# Patient Record
Sex: Male | Born: 1935
Health system: Southern US, Community
[De-identification: ages and names within clinical notes are randomized; demographics above are authoritative.]

## PROBLEM LIST (undated history)

## (undated) DIAGNOSIS — H269 Unspecified cataract: Secondary | ICD-10-CM

## (undated) DIAGNOSIS — I10 Essential (primary) hypertension: Secondary | ICD-10-CM

## (undated) DIAGNOSIS — R413 Other amnesia: Secondary | ICD-10-CM

## (undated) DIAGNOSIS — E538 Deficiency of other specified B group vitamins: Secondary | ICD-10-CM

## (undated) HISTORY — DX: Essential (primary) hypertension: I10

## (undated) HISTORY — DX: Deficiency of other specified B group vitamins: E53.8

## (undated) HISTORY — PX: COLOSTOMY: SHX63

## (undated) HISTORY — DX: Other amnesia: R41.3

## (undated) HISTORY — DX: Unspecified cataract: H26.9

---

## 2016-05-02 DIAGNOSIS — R413 Other amnesia: Secondary | ICD-10-CM | POA: Diagnosis not present

## 2016-05-02 DIAGNOSIS — Z23 Encounter for immunization: Secondary | ICD-10-CM | POA: Diagnosis not present

## 2016-05-02 DIAGNOSIS — H25013 Cortical age-related cataract, bilateral: Secondary | ICD-10-CM | POA: Diagnosis not present

## 2016-05-04 DIAGNOSIS — E538 Deficiency of other specified B group vitamins: Secondary | ICD-10-CM | POA: Diagnosis not present

## 2016-05-05 DIAGNOSIS — E538 Deficiency of other specified B group vitamins: Secondary | ICD-10-CM | POA: Diagnosis not present

## 2016-05-09 DIAGNOSIS — E538 Deficiency of other specified B group vitamins: Secondary | ICD-10-CM | POA: Diagnosis not present

## 2016-05-10 DIAGNOSIS — E538 Deficiency of other specified B group vitamins: Secondary | ICD-10-CM | POA: Diagnosis not present

## 2016-05-11 DIAGNOSIS — E538 Deficiency of other specified B group vitamins: Secondary | ICD-10-CM | POA: Diagnosis not present

## 2016-06-20 DIAGNOSIS — Z0001 Encounter for general adult medical examination with abnormal findings: Secondary | ICD-10-CM | POA: Diagnosis not present

## 2016-06-20 DIAGNOSIS — Z23 Encounter for immunization: Secondary | ICD-10-CM | POA: Diagnosis not present

## 2016-06-20 DIAGNOSIS — I10 Essential (primary) hypertension: Secondary | ICD-10-CM | POA: Diagnosis not present

## 2016-06-20 DIAGNOSIS — E538 Deficiency of other specified B group vitamins: Secondary | ICD-10-CM | POA: Diagnosis not present

## 2016-06-20 DIAGNOSIS — Z79899 Other long term (current) drug therapy: Secondary | ICD-10-CM | POA: Diagnosis not present

## 2016-06-20 DIAGNOSIS — Z136 Encounter for screening for cardiovascular disorders: Secondary | ICD-10-CM | POA: Diagnosis not present

## 2016-06-20 DIAGNOSIS — R413 Other amnesia: Secondary | ICD-10-CM | POA: Diagnosis not present

## 2016-07-07 ENCOUNTER — Ambulatory Visit (INDEPENDENT_AMBULATORY_CARE_PROVIDER_SITE_OTHER): Payer: Medicare Other | Admitting: Neurology

## 2016-07-07 ENCOUNTER — Encounter: Payer: Self-pay | Admitting: Neurology

## 2016-07-07 DIAGNOSIS — R413 Other amnesia: Secondary | ICD-10-CM

## 2016-07-07 DIAGNOSIS — E538 Deficiency of other specified B group vitamins: Secondary | ICD-10-CM

## 2016-07-07 HISTORY — DX: Deficiency of other specified B group vitamins: E53.8

## 2016-07-07 HISTORY — DX: Other amnesia: R41.3

## 2016-07-07 MED ORDER — DONEPEZIL HCL 5 MG PO TABS: 5.0000 mg | ORAL_TABLET | Freq: Every day | ORAL | 1 refills | Status: DC

## 2016-07-07 NOTE — Progress Notes (Signed)
Reason for visit: Memory disturbance  Referring physician: Dr. Francis GainesKoirala  Wyatt King is a 80 y.o. male  History of present illness:  Wyatt King is a 80 year old right-handed black male with a history of a progressive memory disturbance. The patient has been having difficulty with memory for least one year. The patient is now living with his daughter since April 2017. The patient lost his car as it got repossessed at least a year ago, he was not making the payments on the car. The patient has had increasing difficulty with managing his finances, his daughter now has taken over this task. The patient requires assistance with keeping up with medications and appointments. He has difficulty remembering names for people, and remembering recent events. The patient therefore is no longer operating a motor vehicle. The patient denies any numbness or weakness of the face, arms, or legs. He has some slight gait instability, he denies any recent falls. He has some urgency with the bladder. He was found to have a significant vitamin B12 deficiency, he is now on B12 injections. He has not reported a lot of fatigue issues. He claims that he sleeps well at night. He is sent to this office for an evaluation.  Past Medical History:  Diagnosis Date  . Memory disorder 07/07/2016    Past Surgical History:  Procedure Laterality Date  . COLOSTOMY      History reviewed. No pertinent family history.  Social history:  reports that he has never smoked. He has never used smokeless tobacco. He reports that he does not drink alcohol or use drugs.  Medications:  Prior to Admission medications   Medication Sig Start Date End Date Taking? Authorizing Provider  Multiple Vitamin (MULTIVITAMIN) tablet Take 1 tablet by mouth daily.   Yes Historical Provider, MD     No Known Allergies  ROS:  Out of a complete 14 system review of symptoms, the patient complains only of the following symptoms, and all other  reviewed systems are negative.  Blurred vision Memory loss, confusion, dizziness  Blood pressure (!) 154/84, pulse (!) 53, height 5\' 2"  (1.575 m), weight 210 lb (95.3 kg).  Physical Exam  General: The patient is alert and cooperative at the time of the examination. The patient is minimally obese.  Eyes: Pupils are equal, round, and reactive to light. Discs are flat bilaterally.  Neck: The neck is supple, no carotid bruits are noted.  Respiratory: The respiratory examination is clear.  Cardiovascular: The cardiovascular examination reveals a regular rate and rhythm, no obvious murmurs or rubs are noted.  Skin: Extremities are without significant edema.  Neurologic Exam  Mental status: The patient is alert and oriented x 3 at the time of the examination. The Mini-Mental Status Examination done today shows a total score 19/30.  Cranial nerves: Facial symmetry is present. There is good sensation of the face to pinprick and soft touch bilaterally. The strength of the facial muscles and the muscles to head turning and shoulder shrug are normal bilaterally. Speech is well enunciated, no aphasia or dysarthria is noted. Extraocular movements are full. Visual fields are full. The tongue is midline, and the patient has symmetric elevation of the soft palate. No obvious hearing deficits are noted.  Motor: The motor testing reveals 5 over 5 strength of all 4 extremities. Good symmetric motor tone is noted throughout.  Sensory: Sensory testing is intact to pinprick, soft touch, vibration sensation, and position sense on all 4 extremities. No evidence of extinction is  noted.  Coordination: Cerebellar testing reveals good finger-nose-finger and heel-to-shin bilaterally.  Gait and station: Gait is normal. Tandem gait is normal. Romberg is negative. No drift is seen.  Reflexes: Deep tendon reflexes are symmetric and normal bilaterally. Toes are downgoing bilaterally.   Assessment/Plan:  1.  Progressive memory disturbance  2. Vitamin B12 deficiency  The patient will be set up for further blood work today. He will have MRI evaluation of the brain. I have called in a prescription for Aricept to begin at 5 mg at night. He will contact our office in one month if he is doing well, we will convert him to a 10 mg maintenance dose of Aricept. He will follow-up in 6 months for reevaluation.  Marlan Palau. Keith Willis MD 07/07/2016 8:48 AM  [facility] [address] [place], [place] [id number]  Phone [phone] Fax [phone]

## 2016-07-07 NOTE — Patient Instructions (Signed)
   We will check MRI of the brain and some blood work.   We will start Aricept (donepezil) 5 mg at night. Call in one month if this is tolerated and we will start the 10 mg dose of the medication.  Begin Aricept (donepezil) at 5 mg at night for one month. If this medication is well-tolerated, please call our office and we will call in a prescription for the 10 mg tablets. Look out for side effects that may include nausea, diarrhea, weight loss, or stomach cramps. This medication will also cause a runny nose, therefore there is no need for allergy medications for this purpose.

## 2016-07-09 LAB — SEDIMENTATION RATE: Sed Rate: 12 mm/hr (ref 0–30)

## 2016-07-09 LAB — RPR: RPR Ser Ql: NONREACTIVE

## 2016-07-09 LAB — COPPER, SERUM: Copper: 120 ug/dL (ref 72–166)

## 2016-07-10 ENCOUNTER — Telehealth: Payer: Self-pay

## 2016-07-10 NOTE — Telephone Encounter (Signed)
Called pt w/ unremarkable lab results. May call back w/ additional questions/concerns. 

## 2016-07-10 NOTE — Telephone Encounter (Signed)
-----   Message from York Spanielharles K Willis, MD sent at 07/09/2016  4:38 PM EST -----  The blood work results are unremarkable. Please call the patient.  ----- Message ----- From: Nell RangeInterface, [facility] Lab Results In Sent: 07/08/2016   7:42 AM To: York Spanielharles K Willis, MD

## 2016-07-19 ENCOUNTER — Ambulatory Visit
Admission: RE | Admit: 2016-07-19 | Discharge: 2016-07-19 | Disposition: A | Discharge status: Home / Self Care | Payer: Medicare Other | Source: Ambulatory Visit | Attending: Neurology | Admitting: Neurology

## 2016-07-19 DIAGNOSIS — R413 Other amnesia: Secondary | ICD-10-CM

## 2016-07-21 ENCOUNTER — Telehealth: Payer: Self-pay | Admitting: Neurology

## 2016-07-21 DIAGNOSIS — E538 Deficiency of other specified B group vitamins: Secondary | ICD-10-CM | POA: Diagnosis not present

## 2016-07-21 NOTE — Telephone Encounter (Signed)
I called the patient. MRI of the brain does not show significant SVD, some atrophy seen especially in the mesial temporal areas, could be C/W Alzheimer's.  The patient is to continue the Aricept.   MRI brain 07/20/16:  IMPRESSION:  Abnormal MRI brain (without) demonstrating: 1. Mild diffuse and moderate-severe mesial temporal atrophy. 2. Mild scattered periventricular and subcortical foci of non-specific gliosis. 3. Moderate mucosal thickening in the frontal, ethmoid and maxillary sinuses; especially in the right frontal supraorbital region. 4. No acute findings.

## 2016-08-18 DIAGNOSIS — E538 Deficiency of other specified B group vitamins: Secondary | ICD-10-CM | POA: Diagnosis not present

## 2016-08-25 DIAGNOSIS — H43823 Vitreomacular adhesion, bilateral: Secondary | ICD-10-CM | POA: Diagnosis not present

## 2016-08-25 DIAGNOSIS — H2511 Age-related nuclear cataract, right eye: Secondary | ICD-10-CM | POA: Diagnosis not present

## 2016-08-25 DIAGNOSIS — H25013 Cortical age-related cataract, bilateral: Secondary | ICD-10-CM | POA: Diagnosis not present

## 2016-08-25 DIAGNOSIS — H18413 Arcus senilis, bilateral: Secondary | ICD-10-CM | POA: Diagnosis not present

## 2016-08-25 DIAGNOSIS — H2513 Age-related nuclear cataract, bilateral: Secondary | ICD-10-CM | POA: Diagnosis not present

## 2016-08-25 DIAGNOSIS — H25043 Posterior subcapsular polar age-related cataract, bilateral: Secondary | ICD-10-CM | POA: Diagnosis not present

## 2016-09-19 ENCOUNTER — Other Ambulatory Visit: Payer: Self-pay | Admitting: Neurology

## 2016-09-22 ENCOUNTER — Other Ambulatory Visit: Payer: Self-pay | Admitting: Neurology

## 2016-12-18 DIAGNOSIS — H2511 Age-related nuclear cataract, right eye: Secondary | ICD-10-CM | POA: Diagnosis not present

## 2017-01-04 ENCOUNTER — Ambulatory Visit: Payer: Medicare Other | Admitting: Adult Health

## 2017-01-08 ENCOUNTER — Encounter: Payer: Self-pay | Admitting: Adult Health

## 2017-01-08 DIAGNOSIS — H2512 Age-related nuclear cataract, left eye: Secondary | ICD-10-CM | POA: Diagnosis not present

## 2018-01-15 ENCOUNTER — Emergency Department (HOSPITAL_COMMUNITY): Payer: Medicare Other

## 2018-01-15 ENCOUNTER — Emergency Department (HOSPITAL_COMMUNITY)
Admission: EM | Admit: 2018-01-15 | Discharge: 2018-01-15 | Disposition: A | Discharge status: Home / Self Care | Payer: Medicare Other | Attending: Emergency Medicine | Admitting: Emergency Medicine

## 2018-01-15 ENCOUNTER — Encounter (HOSPITAL_COMMUNITY): Payer: Self-pay | Admitting: Nurse Practitioner

## 2018-01-15 ENCOUNTER — Other Ambulatory Visit: Payer: Self-pay

## 2018-01-15 DIAGNOSIS — R251 Tremor, unspecified: Secondary | ICD-10-CM | POA: Diagnosis not present

## 2018-01-15 DIAGNOSIS — Z79899 Other long term (current) drug therapy: Secondary | ICD-10-CM | POA: Insufficient documentation

## 2018-01-15 DIAGNOSIS — R509 Fever, unspecified: Secondary | ICD-10-CM | POA: Diagnosis not present

## 2018-01-15 DIAGNOSIS — N390 Urinary tract infection, site not specified: Secondary | ICD-10-CM | POA: Diagnosis not present

## 2018-01-15 DIAGNOSIS — R531 Weakness: Secondary | ICD-10-CM | POA: Diagnosis not present

## 2018-01-15 DIAGNOSIS — R05 Cough: Secondary | ICD-10-CM | POA: Diagnosis not present

## 2018-01-15 DIAGNOSIS — R404 Transient alteration of awareness: Secondary | ICD-10-CM | POA: Diagnosis not present

## 2018-01-15 LAB — CBC WITH DIFFERENTIAL/PLATELET
Basophils Absolute: 0 10*3/uL (ref 0.0–0.1)
Basophils Relative: 0 %
Eosinophils Absolute: 0.1 10*3/uL (ref 0.0–0.7)
Eosinophils Relative: 1 %
HCT: 40 % (ref 39.0–52.0)
Hemoglobin: 13.3 g/dL (ref 13.0–17.0)
Lymphocytes Relative: 19 %
Lymphs Abs: 1.7 10*3/uL (ref 0.7–4.0)
MCH: 30.4 pg (ref 26.0–34.0)
MCHC: 33.3 g/dL (ref 30.0–36.0)
MCV: 91.3 fL (ref 78.0–100.0)
Monocytes Absolute: 0.7 10*3/uL (ref 0.1–1.0)
Monocytes Relative: 8 %
Neutro Abs: 6.5 10*3/uL (ref 1.7–7.7)
Neutrophils Relative %: 72 %
Platelets: 215 10*3/uL (ref 150–400)
RBC: 4.38 MIL/uL (ref 4.22–5.81)
RDW: 13.2 % (ref 11.5–15.5)
WBC: 8.9 10*3/uL (ref 4.0–10.5)

## 2018-01-15 LAB — URINALYSIS, ROUTINE W REFLEX MICROSCOPIC
Bilirubin Urine: NEGATIVE
Glucose, UA: NEGATIVE mg/dL
Ketones, ur: NEGATIVE mg/dL
Leukocytes, UA: NEGATIVE
Nitrite: POSITIVE — AB
Protein, ur: NEGATIVE mg/dL
Specific Gravity, Urine: 1.012 (ref 1.005–1.030)
pH: 5 (ref 5.0–8.0)

## 2018-01-15 LAB — BASIC METABOLIC PANEL
Anion gap: 12 (ref 5–15)
BUN: 13 mg/dL (ref 6–20)
CO2: 25 mmol/L (ref 22–32)
Calcium: 9 mg/dL (ref 8.9–10.3)
Chloride: 100 mmol/L — ABNORMAL LOW (ref 101–111)
Creatinine, Ser: 0.97 mg/dL (ref 0.61–1.24)
GFR calc Af Amer: >60 mL/min (ref >60)
GFR calc non Af Amer: >60 mL/min (ref >60)
Glucose, Bld: 127 mg/dL — ABNORMAL HIGH (ref 65–99)
Potassium: 3.9 mmol/L (ref 3.5–5.1)
Sodium: 137 mmol/L (ref 135–145)

## 2018-01-15 LAB — LACTIC ACID, PLASMA
Lactic Acid, Venous: 1.8 mmol/L (ref 0.5–1.9)
Lactic Acid, Venous: 1.5 mmol/L (ref 0.5–1.9)

## 2018-01-15 MED ORDER — SODIUM CHLORIDE 0.9 % IV SOLN
2.0000 g | Freq: Once | INTRAVENOUS | Status: AC
  Administered 2018-01-15: 2 g via INTRAVENOUS
  Filled 2018-01-15: qty 20

## 2018-01-15 MED ORDER — CEPHALEXIN 500 MG PO CAPS: 500.0000 mg | ORAL_CAPSULE | Freq: Three times a day (TID) | ORAL | 0 refills | Status: DC

## 2018-01-15 MED ORDER — SODIUM CHLORIDE 0.9 % IV BOLUS
1000.0000 mL | Freq: Once | INTRAVENOUS | Status: AC
  Administered 2018-01-15: 1000 mL via INTRAVENOUS

## 2018-01-15 NOTE — ED Provider Notes (Signed)
[facility]-EMERGENCY DEPT Provider Note   CSN: [id number] Arrival date & time: 01/15/18  1012     History   Chief Complaint No chief complaint on file.   HPI Wyatt King is a 82 y.o. male.  HPI   82yM with weakness. Began feeling poorly 2 days and and has progressed from there. Feels very weak with activity and legs will tremble, particularly the right, when he is walking. Denies any pain. No cough. No dyspnea. No SOB. No urinary complaints. No change in out put from ostomy.   Past Medical History:  Diagnosis Date  . Memory disorder 07/07/2016  . Vitamin B12 deficiency 07/07/2016    Patient Active Problem List   Diagnosis Date Noted  . Memory disorder 07/07/2016  . Vitamin B12 deficiency 07/07/2016    Past Surgical History:  Procedure Laterality Date  . COLOSTOMY          Home Medications    Prior to Admission medications   Medication Sig Start Date End Date Taking? Authorizing Provider  donepezil (ARICEPT) 5 MG tablet TAKE 1 TABLET BY MOUTH AT BEDTIME 09/19/16   York Spaniel, MD  Multiple Vitamin (MULTIVITAMIN) tablet Take 1 tablet by mouth daily.    [provider]    Family History Family History  Problem Relation Age of Onset  . Stroke Father     Social History Social History   Tobacco Use  . Smoking status: Never Smoker  . Smokeless tobacco: Never Used  Substance Use Topics  . Alcohol use: No  . Drug use: No     Allergies   Patient has no known allergies.   Review of Systems Review of Systems  All systems reviewed and negative, other than as noted in HPI.  Physical Exam Updated Vital Signs BP (!) 160/70   Pulse 70   Temp 99.7 F (37.6 C) (Oral)   Resp 16   SpO2 96%   Physical Exam  Constitutional: He is oriented to person, place, and time. He appears well-developed and well-nourished. No distress.  HENT:  Head: Normocephalic and atraumatic.  Eyes: Conjunctivae are normal. Right eye  exhibits no discharge. Left eye exhibits no discharge.  Neck: Neck supple.  Cardiovascular: Normal rate, regular rhythm and normal heart sounds. Exam reveals no gallop and no friction rub.  No murmur heard. Pulmonary/Chest: Effort normal and breath sounds normal. No respiratory distress.  Abdominal: Soft. He exhibits no distension. There is no tenderness.  Musculoskeletal: He exhibits no edema or tenderness.  Neurological: He is alert and oriented to person, place, and time. No cranial nerve deficit. He exhibits normal muscle tone. Coordination normal.  Skin: Skin is warm and dry.  Psychiatric: He has a normal mood and affect. His behavior is normal. Thought content normal.  Nursing note and vitals reviewed.    ED Treatments / Results  Labs (all labs ordered are listed, but only abnormal results are displayed) Labs Reviewed  BASIC METABOLIC PANEL - Abnormal; Notable for the following components:      Result Value   Chloride 100 (*)    Glucose, Bld 127 (*)    All other components within normal limits  URINALYSIS, ROUTINE W REFLEX MICROSCOPIC - Abnormal; Notable for the following components:   Hgb urine dipstick SMALL (*)    Nitrite POSITIVE (*)    Bacteria, UA MANY (*)    All other components within normal limits  URINE CULTURE  CBC WITH DIFFERENTIAL/PLATELET  LACTIC ACID, PLASMA  LACTIC ACID,  PLASMA    EKG EKG Interpretation  Date/Time:  Tuesday Jan 15 2018 13:57:00 EDT Ventricular Rate:  64 PR Interval:    QRS Duration: 97 QT Interval:  436 QTC Calculation: 450 R Axis:   76 Text Interpretation:  Sinus rhythm Non-specific ST-t changes Confirmed by Raeford Razor [phone]) on 01/16/2018 10:48:46 AM   Radiology Dg Chest 2 View  Result Date: 01/15/2018 CLINICAL DATA:  Fever and cough EXAM: CHEST - 2 VIEW COMPARISON:  None. FINDINGS: Lungs are clear. The heart size and pulmonary vascularity are normal. No adenopathy. There is degenerative change in the thoracic spine.  IMPRESSION: No edema or consolidation. Electronically Signed   By: Bretta Bang III M.D.   On: 01/15/2018 11:09   Ct Head Wo Contrast  Result Date: 01/15/2018 CLINICAL DATA:  Fever and weakness.  Right-sided tremors. EXAM: CT HEAD WITHOUT CONTRAST TECHNIQUE: Contiguous axial images were obtained from the base of the skull through the vertex without intravenous contrast. COMPARISON:  MRI brain dated July 19, 2016. FINDINGS: Brain: No evidence of acute infarction, hemorrhage, hydrocephalus, extra-axial collection or mass lesion/mass effect. Stable mild age related cerebral atrophy. Vascular: Calcified atherosclerosis at the skullbase. No hyperdense vessel. Skull: Normal. Negative for fracture or focal lesion. Sinuses/Orbits: No acute finding. Mild bilateral paranasal sinus disease. No air-fluid levels. Other: None. IMPRESSION: 1. Normal for age noncontrast head CT. Electronically Signed   By: Obie Dredge M.D.   On: 01/15/2018 13:20    Procedures Procedures (including critical care time)  Medications Ordered in ED Medications  sodium chloride 0.9 % bolus 1,000 mL (has no administration in time range)     Initial Impression / Assessment and Plan / ED Course  I have reviewed the triage vital signs and the nursing notes.  Pertinent labs & imaging results that were available during my care of the patient were reviewed by me and considered in my medical decision making (see chart for details).     82 year old male with generalized weakness.  Likely related to UTI.  Family reports some mild confusion from his baseline he seems to be fairly lucid to me currently.  He was given a dose of Rocephin in the emergency room.  He will be discharged with a prescription for Keflex.  Urine culture will be sent.  Return precautions discussed with patient and his family at bedside.  Outpatient follow-up otherwise.  Final Clinical Impressions(s) / ED Diagnoses   Final diagnoses:  Urinary tract  infection without hematuria, site unspecified    ED Discharge Orders    None       Raeford Razor, MD 01/16/18 1226

## 2018-01-15 NOTE — ED Triage Notes (Signed)
Patient brought in from home by EMS for fever for 1 day and weakness. Patient has right sided leg pain with tremors on the right side.

## 2018-01-15 NOTE — ED Notes (Signed)
Bed: WA08 Expected date:  Expected time:  Means of arrival:  Comments: EMS-fever 

## 2018-01-15 NOTE — ED Notes (Signed)
Patient voided on his own, did not need to do IN and OUT CATH

## 2018-01-15 NOTE — ED Notes (Signed)
Report given to RN

## 2018-01-15 NOTE — ED Notes (Signed)
Will attempt rectal temp when pt returns from xray

## 2018-01-17 DIAGNOSIS — N3000 Acute cystitis without hematuria: Secondary | ICD-10-CM | POA: Diagnosis not present

## 2018-01-17 DIAGNOSIS — E538 Deficiency of other specified B group vitamins: Secondary | ICD-10-CM | POA: Diagnosis not present

## 2018-01-17 DIAGNOSIS — F0390 Unspecified dementia without behavioral disturbance: Secondary | ICD-10-CM | POA: Diagnosis not present

## 2018-01-17 LAB — URINE CULTURE: Culture: >=100000 — AB

## 2018-01-25 DIAGNOSIS — E538 Deficiency of other specified B group vitamins: Secondary | ICD-10-CM | POA: Diagnosis not present

## 2018-01-25 DIAGNOSIS — F0390 Unspecified dementia without behavioral disturbance: Secondary | ICD-10-CM | POA: Diagnosis not present

## 2018-02-01 DIAGNOSIS — E538 Deficiency of other specified B group vitamins: Secondary | ICD-10-CM | POA: Diagnosis not present

## 2018-04-20 ENCOUNTER — Emergency Department (HOSPITAL_COMMUNITY)
Admission: EM | Admit: 2018-04-20 | Discharge: 2018-04-20 | Disposition: A | Discharge status: Home / Self Care | Payer: Medicare Other | Attending: Emergency Medicine | Admitting: Emergency Medicine

## 2018-04-20 ENCOUNTER — Emergency Department (HOSPITAL_COMMUNITY): Payer: Medicare Other

## 2018-04-20 ENCOUNTER — Other Ambulatory Visit: Payer: Self-pay

## 2018-04-20 DIAGNOSIS — N3000 Acute cystitis without hematuria: Secondary | ICD-10-CM | POA: Diagnosis not present

## 2018-04-20 DIAGNOSIS — R410 Disorientation, unspecified: Secondary | ICD-10-CM | POA: Diagnosis not present

## 2018-04-20 DIAGNOSIS — R4182 Altered mental status, unspecified: Secondary | ICD-10-CM | POA: Diagnosis not present

## 2018-04-20 DIAGNOSIS — F0390 Unspecified dementia without behavioral disturbance: Secondary | ICD-10-CM | POA: Insufficient documentation

## 2018-04-20 DIAGNOSIS — Z79899 Other long term (current) drug therapy: Secondary | ICD-10-CM | POA: Diagnosis not present

## 2018-04-20 LAB — RAPID URINE DRUG SCREEN, HOSP PERFORMED
AMPHETAMINES: NOT DETECTED
Barbiturates: NOT DETECTED
Benzodiazepines: NOT DETECTED
Cocaine: NOT DETECTED
OPIATES: NOT DETECTED
Tetrahydrocannabinol: NOT DETECTED

## 2018-04-20 LAB — URINALYSIS, ROUTINE W REFLEX MICROSCOPIC
Bilirubin Urine: NEGATIVE
Glucose, UA: NEGATIVE mg/dL
HGB URINE DIPSTICK: NEGATIVE
Ketones, ur: NEGATIVE mg/dL
Nitrite: NEGATIVE
PROTEIN: NEGATIVE mg/dL
Specific Gravity, Urine: 1.008 (ref 1.005–1.030)
pH: 7 (ref 5.0–8.0)

## 2018-04-20 LAB — COMPREHENSIVE METABOLIC PANEL
ALK PHOS: 83 U/L (ref 38–126)
ALT: 24 U/L (ref 0–44)
AST: 30 U/L (ref 15–41)
Albumin: 3.8 g/dL (ref 3.5–5.0)
Anion gap: 9 (ref 5–15)
BILIRUBIN TOTAL: 1.6 mg/dL — AB (ref 0.3–1.2)
BUN: 11 mg/dL (ref 8–23)
CALCIUM: 9.1 mg/dL (ref 8.9–10.3)
CO2: 28 mmol/L (ref 22–32)
Chloride: 101 mmol/L (ref 98–111)
Creatinine, Ser: 1.1 mg/dL (ref 0.61–1.24)
GFR calc Af Amer: >60 mL/min (ref >60)
GFR calc non Af Amer: >60 mL/min (ref >60)
GLUCOSE: 94 mg/dL (ref 70–99)
POTASSIUM: 4 mmol/L (ref 3.5–5.1)
Sodium: 138 mmol/L (ref 135–145)
TOTAL PROTEIN: 7.8 g/dL (ref 6.5–8.1)

## 2018-04-20 LAB — I-STAT TROPONIN, ED: Troponin i, poc: 0 ng/mL (ref 0.00–0.08)

## 2018-04-20 LAB — CK: Total CK: 279 U/L (ref 49–397)

## 2018-04-20 LAB — CBC WITH DIFFERENTIAL/PLATELET
ABS IMMATURE GRANULOCYTES: 0 10*3/uL (ref 0.0–0.1)
Basophils Absolute: 0 10*3/uL (ref 0.0–0.1)
Basophils Relative: 1 %
Eosinophils Absolute: 0.1 10*3/uL (ref 0.0–0.7)
Eosinophils Relative: 2 %
HEMATOCRIT: 36.7 % — AB (ref 39.0–52.0)
HEMOGLOBIN: 11.8 g/dL — AB (ref 13.0–17.0)
IMMATURE GRANULOCYTES: 1 %
LYMPHS ABS: 1.6 10*3/uL (ref 0.7–4.0)
LYMPHS PCT: 30 %
MCH: 29.8 pg (ref 26.0–34.0)
MCHC: 32.2 g/dL (ref 30.0–36.0)
MCV: 92.7 fL (ref 78.0–100.0)
Monocytes Absolute: 0.5 10*3/uL (ref 0.1–1.0)
Monocytes Relative: 10 %
NEUTROS PCT: 56 %
Neutro Abs: 2.9 10*3/uL (ref 1.7–7.7)
Platelets: 184 10*3/uL (ref 150–400)
RBC: 3.96 MIL/uL — AB (ref 4.22–5.81)
RDW: 12.6 % (ref 11.5–15.5)
WBC: 5.2 10*3/uL (ref 4.0–10.5)

## 2018-04-20 MED ORDER — SODIUM CHLORIDE 0.9 % IV BOLUS
1000.0000 mL | Freq: Once | INTRAVENOUS | Status: AC
  Administered 2018-04-20: 1000 mL via INTRAVENOUS

## 2018-04-20 MED ORDER — CEPHALEXIN 250 MG PO CAPS
500.0000 mg | ORAL_CAPSULE | Freq: Once | ORAL | Status: AC
  Administered 2018-04-20: 500 mg via ORAL
  Filled 2018-04-20: qty 2

## 2018-04-20 MED ORDER — CEPHALEXIN 500 MG PO CAPS: 500.0000 mg | ORAL_CAPSULE | Freq: Four times a day (QID) | ORAL | 0 refills | Status: DC

## 2018-04-20 NOTE — ED Notes (Signed)
Pt alert and oriented in NAD. Pt and dtr verbalized understanding of discharge instructions.

## 2018-04-20 NOTE — ED Notes (Signed)
Placed pt on 2lpm as SPO2 sats decreased.

## 2018-04-20 NOTE — ED Provider Notes (Signed)
[facility] EMERGENCY DEPARTMENT Provider Note   CSN: [id number] Arrival date & time: 04/20/18  1505     History   Chief Complaint No chief complaint on file.   HPI Wyatt King is a 82 y.o. male.  Patient was found wandering outside.  He was brought in by the EMS.  Then his daughter showed up and she stated that he has significant dementia and has been really confused over the last few months.  Although the last few weeks he is gotten worse.  She stays with small at time  The history is provided by the patient and a relative. No language interpreter was used.  Altered Mental Status   This is a chronic problem. The problem has not changed since onset.Associated symptoms include confusion. Risk factors: History of dementia. His past medical history does not include seizures.    Past Medical History:  Diagnosis Date  . Memory disorder 07/07/2016  . Vitamin B12 deficiency 07/07/2016    Patient Active Problem List   Diagnosis Date Noted  . Memory disorder 07/07/2016  . Vitamin B12 deficiency 07/07/2016    Past Surgical History:  Procedure Laterality Date  . COLOSTOMY          Home Medications    Prior to Admission medications   Medication Sig Start Date End Date Taking? Authorizing Provider  amLODipine (NORVASC) 10 MG tablet Take 10 mg by mouth daily. 12/02/17   [provider]  cephALEXin (KEFLEX) 500 MG capsule Take 1 capsule (500 mg total) by mouth 4 (four) times daily. 04/20/18   Bethann Berkshire, MD  donepezil (ARICEPT) 10 MG tablet Take 10 mg by mouth at bedtime. 12/02/17   [provider]  donepezil (ARICEPT) 5 MG tablet TAKE 1 TABLET BY MOUTH AT BEDTIME Patient not taking: Reported on 01/15/2018 09/19/16   York Spaniel, MD  latanoprost (XALATAN) 0.005 % ophthalmic solution Place 1 drop into both eyes at bedtime. 12/02/17   [provider]  Multiple Vitamin (MULTIVITAMIN) tablet Take 1 tablet by mouth daily.     [provider]    Family History Family History  Problem Relation Age of Onset  . Stroke Father     Social History Social History   Tobacco Use  . Smoking status: Never Smoker  . Smokeless tobacco: Never Used  Substance Use Topics  . Alcohol use: No  . Drug use: No     Allergies   Patient has no known allergies.   Review of Systems Review of Systems  Unable to perform ROS: Dementia  Psychiatric/Behavioral: Positive for confusion.     Physical Exam Updated Vital Signs BP (!) 154/91   Pulse (!) 59   Temp 98.1 F (36.7 C) (Oral)   Resp 16   SpO2 99%   Physical Exam  Constitutional: He appears well-developed.  HENT:  Head: Normocephalic.  Eyes: Conjunctivae and EOM are normal. No scleral icterus.  Neck: Neck supple. No thyromegaly present.  Cardiovascular: Normal rate and regular rhythm. Exam reveals no gallop and no friction rub.  No murmur heard. Pulmonary/Chest: No stridor. He has no wheezes. He has no rales. He exhibits no tenderness.  Abdominal: He exhibits no distension. There is no tenderness. There is no rebound.  Musculoskeletal: Normal range of motion. He exhibits no edema.  Lymphadenopathy:    He has no cervical adenopathy.  Neurological: He is alert. He exhibits normal muscle tone. Coordination normal.  Oriented to person only  Skin: No rash noted. No  erythema.  Psychiatric: He has a normal mood and affect. His behavior is normal.     ED Treatments / Results  Labs (all labs ordered are listed, but only abnormal results are displayed) Labs Reviewed  CBC WITH DIFFERENTIAL/PLATELET - Abnormal; Notable for the following components:      Result Value   RBC 3.96 (*)    Hemoglobin 11.8 (*)    HCT 36.7 (*)    All other components within normal limits  COMPREHENSIVE METABOLIC PANEL - Abnormal; Notable for the following components:   Total Bilirubin 1.6 (*)    All other components within normal limits  URINALYSIS, ROUTINE W REFLEX  MICROSCOPIC - Abnormal; Notable for the following components:   Leukocytes, UA SMALL (*)    Bacteria, UA MANY (*)    All other components within normal limits  URINE CULTURE  CK  RAPID URINE DRUG SCREEN, HOSP PERFORMED  I-STAT TROPONIN, ED    EKG None  Radiology Dg Chest 2 View  Result Date: 04/20/2018 CLINICAL DATA:  Altered mental status EXAM: CHEST - 2 VIEW COMPARISON:  Jan 15, 2018 FINDINGS: No edema or consolidation. The heart size and pulmonary vascularity are normal. No adenopathy. No pneumothorax. No bone lesions. IMPRESSION: No edema or consolidation. Electronically Signed   By: Bretta BangWilliam  Woodruff III M.D.   On: 04/20/2018 16:17   Ct Head Wo Contrast  Result Date: 04/20/2018 CLINICAL DATA:  Found on sidewalk.  Altered mental status. EXAM: CT HEAD WITHOUT CONTRAST CT CERVICAL SPINE WITHOUT CONTRAST TECHNIQUE: Multidetector CT imaging of the head and cervical spine was performed following the standard protocol without intravenous contrast. Multiplanar CT image reconstructions of the cervical spine were also generated. COMPARISON:  Head CT 01/15/2018 FINDINGS: CT HEAD FINDINGS Brain: No evidence of acute infarction, hemorrhage, hydrocephalus, extra-axial collection or mass lesion/mass effect. Mild deep white matter microangiopathy. Vascular: Calcific atherosclerotic disease at the skull base. Skull: Normal. Negative for fracture or focal lesion. Sinuses/Orbits: Mucosal thickening of bilateral ethmoid, maxillary and right sphenoid sinuses. Other: None. CT CERVICAL SPINE FINDINGS Alignment: Straightening of cervical lordosis. Skull base and vertebrae: No acute fracture. No primary bone lesion or focal pathologic process. 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 Soft tissues and spinal canal: No prevertebral fluid or swelling. No visible canal hematoma. Disc levels: Multilevel moderate in severity osteoarthritic changes. Upper chest: Negative. Other: None. IMPRESSION: No acute  intracranial abnormality. Mild deep white matter microangiopathy. No evidence of acute traumatic injury to the cervical spine. Multilevel osteoarthritic changes. Electronically Signed   By: Ted Mcalpineobrinka  Dimitrova M.D.   On: 04/20/2018 17:35   Ct Cervical Spine Wo Contrast  Result Date: 04/20/2018 CLINICAL DATA:  Found on sidewalk.  Altered mental status. EXAM: CT HEAD WITHOUT CONTRAST CT CERVICAL SPINE WITHOUT CONTRAST TECHNIQUE: Multidetector CT imaging of the head and cervical spine was performed following the standard protocol without intravenous contrast. Multiplanar CT image reconstructions of the cervical spine were also generated. COMPARISON:  Head CT 01/15/2018 FINDINGS: CT HEAD FINDINGS Brain: No evidence of acute infarction, hemorrhage, hydrocephalus, extra-axial collection or mass lesion/mass effect. Mild deep white matter microangiopathy. Vascular: Calcific atherosclerotic disease at the skull base. Skull: Normal. Negative for fracture or focal lesion. Sinuses/Orbits: Mucosal thickening of bilateral ethmoid, maxillary and right sphenoid sinuses. Other: None. CT CERVICAL SPINE FINDINGS Alignment: Straightening of cervical lordosis. Skull base and vertebrae: No acute fracture. No primary bone lesion or focal pathologic process. 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 Soft tissues  and spinal canal: No prevertebral fluid or swelling. No visible canal hematoma. Disc levels: Multilevel moderate in severity osteoarthritic changes. Upper chest: Negative. Other: None. IMPRESSION: No acute intracranial abnormality. Mild deep white matter microangiopathy. No evidence of acute traumatic injury to the cervical spine. Multilevel osteoarthritic changes. Electronically Signed   By: Ted Mcalpineobrinka  Dimitrova M.D.   On: 04/20/2018 17:35    Procedures Procedures (including critical care time)  Medications Ordered in ED Medications  cephALEXin (KEFLEX) capsule 500 mg (has no administration in time  range)  sodium chloride 0.9 % bolus 1,000 mL (0 mLs Intravenous Stopped 04/20/18 1842)     Initial Impression / Assessment and Plan / ED Course  I have reviewed the triage vital signs and the nursing notes.  Pertinent labs & imaging results that were available during my care of the patient were reviewed by me and considered in my medical decision making (see chart for details).     Labs x-rays unremarkable except for urine suggest UTI.  Patient has dementia and urinary tract infection he will be given Keflex and will follow-up with PCP  Final Clinical Impressions(s) / ED Diagnoses   Final diagnoses:  Acute cystitis without hematuria    ED Discharge Orders         Ordered    cephALEXin (KEFLEX) 500 MG capsule  4 times daily     04/20/18 1910           Bethann BerkshireZammit, Naftali Carchi, MD 04/20/18 1916

## 2018-04-20 NOTE — ED Triage Notes (Signed)
Pt to ED via EMS, found by neighbors on sidewalk. Clammy and talking in garbled words on EMS arrival. Still confused but speech clear, passed stroke screen with EMS. Unknown where pt lives or baseline mental status.

## 2018-04-20 NOTE — ED Notes (Signed)
Got patient undress on the monitor did ekg shown to Dr Zammit patient is resting with call bell in reach 

## 2018-04-20 NOTE — Discharge Instructions (Addendum)
Follow-up with your family doctor next week for recheck. 

## 2018-04-20 NOTE — ED Notes (Signed)
Patient transported to CT 

## 2018-04-22 LAB — URINE CULTURE

## 2018-04-23 ENCOUNTER — Telehealth: Payer: Self-pay | Admitting: Emergency Medicine

## 2018-04-23 NOTE — Telephone Encounter (Signed)
Post ED Visit - Positive Culture Follow-up  Culture report reviewed by antimicrobial stewardship pharmacist:  []  Enzo BiNathan Batchelder, Pharm.D. []  Celedonio MiyamotoJeremy Frens, Pharm.D., BCPS AQ-ID []  Garvin FilaMike Maccia, Pharm.D., BCPS []  Georgina PillionElizabeth Martin, Pharm.D., BCPS []  Dalton GardensMinh Pham, [address] BoulevardPharm.D., BCPS, AAHIVP []  Estella HuskMichelle Turner, Pharm.D., BCPS, AAHIVP [x]  Lysle Pearlachel Rumbarger, PharmD, BCPS []  Phillips Climeshuy Dang, PharmD, BCPS []  Agapito GamesAlison Masters, PharmD, BCPS []  Verlan FriendsErin Deja, PharmD  Positive urine culture Treated with cephalexin, organism sensitive to the same and no further patient follow-up is required at this time.  Berle MullMiller, Anaise Sterbenz 04/23/2018, 9:30 AM

## 2018-07-30 ENCOUNTER — Emergency Department (HOSPITAL_COMMUNITY)
Admission: EM | Admit: 2018-07-30 | Discharge: 2018-07-30 | Disposition: A | Discharge status: Home / Self Care | Payer: Medicare Other | Attending: Emergency Medicine | Admitting: Emergency Medicine

## 2018-07-30 ENCOUNTER — Encounter (HOSPITAL_COMMUNITY): Payer: Self-pay

## 2018-07-30 ENCOUNTER — Other Ambulatory Visit: Payer: Self-pay

## 2018-07-30 DIAGNOSIS — Z736 Limitation of activities due to disability: Secondary | ICD-10-CM | POA: Diagnosis not present

## 2018-07-30 DIAGNOSIS — Z789 Other specified health status: Secondary | ICD-10-CM | POA: Insufficient documentation

## 2018-07-30 DIAGNOSIS — R413 Other amnesia: Secondary | ICD-10-CM | POA: Diagnosis present

## 2018-07-30 LAB — CBC
HCT: 37.3 % — ABNORMAL LOW (ref 39.0–52.0)
HEMOGLOBIN: 11.6 g/dL — AB (ref 13.0–17.0)
MCH: 29.7 pg (ref 26.0–34.0)
MCHC: 31.1 g/dL (ref 30.0–36.0)
MCV: 95.4 fL (ref 80.0–100.0)
PLATELETS: 209 10*3/uL (ref 150–400)
RBC: 3.91 MIL/uL — AB (ref 4.22–5.81)
RDW: 13 % (ref 11.5–15.5)
WBC: 7.3 10*3/uL (ref 4.0–10.5)
nRBC: 0 % (ref 0.0–0.2)

## 2018-07-30 LAB — URINALYSIS, ROUTINE W REFLEX MICROSCOPIC
Bilirubin Urine: NEGATIVE
GLUCOSE, UA: NEGATIVE mg/dL
Ketones, ur: NEGATIVE mg/dL
Leukocytes, UA: NEGATIVE
NITRITE: NEGATIVE
PH: 6 (ref 5.0–8.0)
PROTEIN: NEGATIVE mg/dL
Specific Gravity, Urine: 1.011 (ref 1.005–1.030)

## 2018-07-30 LAB — BASIC METABOLIC PANEL
Anion gap: 9 (ref 5–15)
BUN: 8 mg/dL (ref 8–23)
CO2: 26 mmol/L (ref 22–32)
Calcium: 8.8 mg/dL — ABNORMAL LOW (ref 8.9–10.3)
Chloride: 102 mmol/L (ref 98–111)
Creatinine, Ser: 0.79 mg/dL (ref 0.61–1.24)
GFR calc Af Amer: >60 mL/min (ref >60)
Glucose, Bld: 104 mg/dL — ABNORMAL HIGH (ref 70–99)
POTASSIUM: 3.5 mmol/L (ref 3.5–5.1)
SODIUM: 137 mmol/L (ref 135–145)

## 2018-07-30 NOTE — Clinical Social Work Note (Addendum)
Clinical Social Work Assessment  Patient Details  Name: Wyatt King MRN: [id number] Date of Birth: 04-26-1936  Date of referral:  07/30/18               Reason for consult:  Facility Placement                Permission sought to share information with:  Family Supports Permission granted to share information::  Yes, Verbal Permission Granted  Name::     Wyatt King [phone]  Agency::     Relationship::     Contact Information:     Housing/Transportation Living arrangements for the past 2 months:  [place] of Information:  Patient Patient Interpreter Needed:  None Criminal Activity/Legal Involvement Pertinent to Current Situation/Hospitalization:    Significant Relationships:  Adult Children, Other(Comment)(Daughter in law ) Lives with:  Adult Children Do you feel safe going back to the place where you live?  Yes Need for family participation in patient care:  Yes (Comment)  Care giving concerns:  CSW consulted for assistance with placement. Per pt and pt's daughter in law, pt ambulates with no difficulty at home and does not use any equipment.   Social Worker assessment / plan:  CSW met with pt and pt's daughter in law at bedside. Pt is from home, where he is living with another daughter, Wyatt King. Pt's daughter in law has concerns about care pt is receiving at home. Pt's daughter in law is looking to have pt placed in a Memory Care unit. CSW reviewed steps for Memory Care placement from the community. CSW provided pt's daughter in law with the contact information for DSS APS and Assisted Living Locators. 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 CSW spoke to pt and pt's daughter in law about reaching out to the [place] to see what services they have available for assisting with placement in Memory Care. CSW spoke with daughter in law about  completing an APS report if she feels pt's money is being taken from him and other concerns she has about pt's home.   RNCM is  establishing pt with home health. Home Health SW can support pt and pt's daughter in law in applying for long term Medicaid, utilizing VA benefits, and placing pt in a Memory Care Unit. Pt and pt's daughter in law feel safe having pt transition home with home health, while working towards [place] Placement.   Employment status:  Retired Forensic scientist:  Medicare PT Recommendations:  Not assessed at this time [place] / Referral to community resources:  Other (Comment Required)(DSS, VA, Assisted Living Locators)  Patient/Family's Response to care:  Pt and pt's daughter in law are agreeable to plan of care, having pt transition home with home health. Pt's daughter in law continuing to work on pt finding placement in [place].   Patient/Family's Understanding of and Emotional Response to Diagnosis, Current Treatment, and Prognosis:  Pt and pt's daughter in law did not have any concerns for CSW at this time. CSW provided pt's daughter in law with resources.   Emotional Assessment Appearance:  Appears stated age Attitude/Demeanor/Rapport:    Affect (typically observed):  Accepting, Adaptable, Appropriate, Pleasant Orientation:    Alcohol / Substance use:    Psych involvement (Current and /or in the community):  No (Comment)  Discharge Needs  Concerns to be addressed:  No discharge needs identified Readmission within the last 30 days:  No Current discharge risk:  None Barriers to Discharge:  No Barriers  Identified   Wendelyn Breslow, LCSW 07/30/2018, 7:43 PM

## 2018-07-30 NOTE — Discharge Instructions (Addendum)
You were evaluated in the Emergency Department and after careful evaluation, we did not find any emergent condition requiring admission or further testing in the hospital.  I am glad that we were able to set up more home health services.  Please return to the Emergency Department if you experience any worsening of your condition.  We encourage you to follow up with a primary care provider.  Thank you for allowing us to be a part of your care.

## 2018-07-30 NOTE — ED Provider Notes (Signed)
[facility] Emergency Department Provider Note MRN:  [id number]  Arrival date & time: 07/30/18     Chief Complaint   Memory Loss   History of Present Illness   Wyatt King is a 82 y.o. year-old male with a history of memory disorder presenting to the ED with chief complaint of memory loss.  According to report, patient is having increased memory loss, no longer caring for self, not managing his colostomy bag, family concerned and wanting a new living situation.  I was unable to obtain an accurate HPI, PMH, or ROS due to the patient's dementia.  Review of Systems  A complete 10 system review of systems was obtained and all systems are negative except as noted in the HPI and PMH.   Patient's Health History    Past Medical History:  Diagnosis Date  . Memory disorder 07/07/2016  . Vitamin B12 deficiency 07/07/2016    Past Surgical History:  Procedure Laterality Date  . COLOSTOMY      Family History  Problem Relation Age of Onset  . Stroke Father     Social History   Socioeconomic History  . Marital status: Widowed    Spouse name: Not on file  . Number of children: 5  . Years of education: Associates  . Highest education level: Not on file  Occupational History  . Occupation: N/A  Social Needs  . Financial resource strain: Not on file  . Food insecurity:    Worry: Not on file    Inability: Not on file  . Transportation needs:    Medical: Not on file    Non-medical: Not on file  Tobacco Use  . Smoking status: Never Smoker  . Smokeless tobacco: Never Used  Substance and Sexual Activity  . Alcohol use: No  . Drug use: No  . Sexual activity: Not on file  Lifestyle  . Physical activity:    Days per week: Not on file    Minutes per session: Not on file  . Stress: Not on file  Relationships  . Social connections:    Talks on phone: Not on file    Gets together: Not on file    Attends religious service: Not on file    Active member of club  or organization: Not on file    Attends meetings of clubs or organizations: Not on file    Relationship status: Not on file  . Intimate partner violence:    Fear of current or ex partner: Not on file    Emotionally abused: Not on file    Physically abused: Not on file    Forced sexual activity: Not on file  Other Topics Concern  . Not on file  Social History Narrative   Lives at home w/ his daughter and grandson   Right-handed   Caffeine: 2-3 cups of coffee as well as tea/soda during the day     Physical Exam  Vital Signs and Nursing Notes reviewed Vitals:   07/30/18 2000 07/30/18 2031  BP: (!) 144/64 (!) 103/53  Pulse:  (!) 59  Resp:    Temp:    SpO2: 100% 99%    CONSTITUTIONAL: Well-appearing, NAD NEURO:  Alert and oriented x 3, no focal deficits EYES:  eyes equal and reactive ENT/NECK:  no LAD, no JVD CARDIO: Regular rate, well-perfused, normal S1 and S2 PULM:  CTAB no wheezing or rhonchi GI/GU:  normal bowel sounds, non-distended, non-tender MSK/SPINE:  No gross deformities, no edema SKIN:  no  rash, atraumatic; non-contained colostomy bag with leaking feculent material PSYCH:  Appropriate speech and behavior  Diagnostic and Interventional Summary    Labs Reviewed  BASIC METABOLIC PANEL - Abnormal; Notable for the following components:      Result Value   Glucose, Bld 104 (*)    Calcium 8.8 (*)    All other components within normal limits  CBC - Abnormal; Notable for the following components:   RBC 3.91 (*)    Hemoglobin 11.6 (*)    HCT 37.3 (*)    All other components within normal limits  URINALYSIS, ROUTINE W REFLEX MICROSCOPIC - Abnormal; Notable for the following components:   APPearance HAZY (*)    Hgb urine dipstick SMALL (*)    Bacteria, UA MANY (*)    All other components within normal limits    No orders to display    Medications - No data to display   Procedures Critical Care  ED Course and Medical Decision Making  I have reviewed the  triage vital signs and the nursing notes.  Pertinent labs & imaging results that were available during my care of the patient were reviewed by me and considered in my medical decision making (see below for details).  Unclear reason for ED visit, no family with patient currently, will attempt to gather more information.  Patient's daughter-in-law arrived and provided more information.  Patient has been living with his daughter, who according to daughter-in-law has not taking care of him.  Patient is losing weight, is covered in feces, his colostomy bag is not being cared for.  Patient's daughter and patient are interested in different living situation.  Patient has no indication for admission at this time, no complaints, labs unremarkable, no UTI, mentally and neurologically at baseline.  Social work and case management consulted, home health resources provided and will start tomorrow morning.  After the discussed management above, the patient was determined to be safe for discharge.  The patient was in agreement with this plan and all questions regarding their care were answered.  ED return precautions were discussed and the patient will return to the ED with any significant worsening of condition.  Elmer Sow. Pilar Plate, MD [facility] Emergency Medicine [facility] [email or web address]  Final Clinical Impressions(s) / ED Diagnoses     ICD-10-CM   1. Decreased activities of daily living (ADL) Z78.9     ED Discharge Orders    None         Sabas Sous, MD 07/30/18 2105

## 2018-07-30 NOTE — Care Management Note (Addendum)
Case Management Note  Patient Details  Name: Wyatt CockingJessie King MRN: [id number] Date of Birth: October 27, 1935  Subjective/Objective:                  Memory Loss  Action/Plan: ED CM and ED CSW spoke with the patient and his daughter-in-law Wyatt Pascalmily Price at the bedside. Wyatt King is requesting assistance with placement. She reports the patient's adopted daughter who is in her 4320s and her 80 year old child live with the patient. She reports the patient is not being taken care of. The patient states other people are in his home besides Wyatt King (his adopted daughter) and the child. The patient reports he would like to move and agrees with Wyatt BurtonEmily that he needs to be in a safe place. Wyatt King reports the patient is not eating and his colostomy bag is not being changed. She states he has not been able to bathe in a while. CSW discussed options for placement by the home health SW. [facility] RN, SW and HHA. They report the patient is able to ambulate without assistance. He does not have or require any DME at this time per the patient and daughter-in-law. Home Health orders were faxed to [facility]. Confirmation received.   Wyatt King (daughter-in-law) is the contact for the patient - [phone].   Expected Discharge Date:    07/30/18              Expected Discharge Plan:  Home w Home Health Services  In-House Referral:  Clinical Social Work  Discharge planning Services  CM Consult  Post Acute Care Choice:  Home Health Choice offered to:  Adult Children, Patient  DME Arranged:  N/A DME Agency:  NA  HH Arranged:  RN, Nurse's Aide, Social Work [facility]  Status of Service:  Completed, signed off  If discussed at [employer] Meetings, dates discussed:    Additional Comments:  [name], [name], RN 07/30/2018, 7:47 PM

## 2018-07-30 NOTE — ED Triage Notes (Addendum)
Pt brought in by daughter with multiple complaints. Pt has a colostomy that has not been changed. He smells of urine. Pt also is reported to have increased memory loss and weakness. Pt noted to have leaking from his colostomy bag. Pt's family member lives out of town and states she is unsure of who is changing his colostomy and caring for him.

## 2018-07-30 NOTE — ED Notes (Signed)
Patient verbalizes understanding of discharge instructions. Opportunity for questioning and answers were provided. 

## 2018-08-08 DIAGNOSIS — G309 Alzheimer's disease, unspecified: Secondary | ICD-10-CM | POA: Diagnosis not present

## 2018-08-08 DIAGNOSIS — I10 Essential (primary) hypertension: Secondary | ICD-10-CM | POA: Diagnosis not present

## 2018-08-08 DIAGNOSIS — Z433 Encounter for attention to colostomy: Secondary | ICD-10-CM | POA: Diagnosis not present

## 2018-08-08 DIAGNOSIS — Z87440 Personal history of urinary (tract) infections: Secondary | ICD-10-CM | POA: Diagnosis not present

## 2018-08-08 DIAGNOSIS — F0280 Dementia in other diseases classified elsewhere without behavioral disturbance: Secondary | ICD-10-CM | POA: Diagnosis not present

## 2018-08-15 DIAGNOSIS — F0280 Dementia in other diseases classified elsewhere without behavioral disturbance: Secondary | ICD-10-CM | POA: Diagnosis not present

## 2018-08-15 DIAGNOSIS — G309 Alzheimer's disease, unspecified: Secondary | ICD-10-CM | POA: Diagnosis not present

## 2018-08-15 DIAGNOSIS — Z433 Encounter for attention to colostomy: Secondary | ICD-10-CM | POA: Diagnosis not present

## 2018-08-15 DIAGNOSIS — I10 Essential (primary) hypertension: Secondary | ICD-10-CM | POA: Diagnosis not present

## 2018-08-15 DIAGNOSIS — Z87440 Personal history of urinary (tract) infections: Secondary | ICD-10-CM | POA: Diagnosis not present

## 2018-08-16 DIAGNOSIS — G309 Alzheimer's disease, unspecified: Secondary | ICD-10-CM | POA: Diagnosis not present

## 2018-08-16 DIAGNOSIS — Z433 Encounter for attention to colostomy: Secondary | ICD-10-CM | POA: Diagnosis not present

## 2018-08-16 DIAGNOSIS — Z87440 Personal history of urinary (tract) infections: Secondary | ICD-10-CM | POA: Diagnosis not present

## 2018-08-16 DIAGNOSIS — I10 Essential (primary) hypertension: Secondary | ICD-10-CM | POA: Diagnosis not present

## 2018-08-16 DIAGNOSIS — F0280 Dementia in other diseases classified elsewhere without behavioral disturbance: Secondary | ICD-10-CM | POA: Diagnosis not present

## 2018-08-21 DIAGNOSIS — I10 Essential (primary) hypertension: Secondary | ICD-10-CM | POA: Diagnosis not present

## 2018-08-21 DIAGNOSIS — Z789 Other specified health status: Secondary | ICD-10-CM | POA: Diagnosis not present

## 2018-08-21 DIAGNOSIS — E538 Deficiency of other specified B group vitamins: Secondary | ICD-10-CM | POA: Diagnosis not present

## 2018-08-21 DIAGNOSIS — Z23 Encounter for immunization: Secondary | ICD-10-CM | POA: Diagnosis not present

## 2018-08-21 DIAGNOSIS — F0390 Unspecified dementia without behavioral disturbance: Secondary | ICD-10-CM | POA: Diagnosis not present

## 2018-08-22 DIAGNOSIS — G309 Alzheimer's disease, unspecified: Secondary | ICD-10-CM | POA: Diagnosis not present

## 2018-08-22 DIAGNOSIS — F0280 Dementia in other diseases classified elsewhere without behavioral disturbance: Secondary | ICD-10-CM | POA: Diagnosis not present

## 2018-08-22 DIAGNOSIS — I10 Essential (primary) hypertension: Secondary | ICD-10-CM | POA: Diagnosis not present

## 2018-08-22 DIAGNOSIS — Z433 Encounter for attention to colostomy: Secondary | ICD-10-CM | POA: Diagnosis not present

## 2018-08-22 DIAGNOSIS — Z87440 Personal history of urinary (tract) infections: Secondary | ICD-10-CM | POA: Diagnosis not present

## 2018-08-23 DIAGNOSIS — G309 Alzheimer's disease, unspecified: Secondary | ICD-10-CM | POA: Diagnosis not present

## 2018-08-23 DIAGNOSIS — F0280 Dementia in other diseases classified elsewhere without behavioral disturbance: Secondary | ICD-10-CM | POA: Diagnosis not present

## 2018-08-23 DIAGNOSIS — I10 Essential (primary) hypertension: Secondary | ICD-10-CM | POA: Diagnosis not present

## 2018-08-23 DIAGNOSIS — Z87440 Personal history of urinary (tract) infections: Secondary | ICD-10-CM | POA: Diagnosis not present

## 2018-08-23 DIAGNOSIS — Z433 Encounter for attention to colostomy: Secondary | ICD-10-CM | POA: Diagnosis not present

## 2018-08-27 DIAGNOSIS — F0280 Dementia in other diseases classified elsewhere without behavioral disturbance: Secondary | ICD-10-CM | POA: Diagnosis not present

## 2018-08-27 DIAGNOSIS — I10 Essential (primary) hypertension: Secondary | ICD-10-CM | POA: Diagnosis not present

## 2018-08-27 DIAGNOSIS — G309 Alzheimer's disease, unspecified: Secondary | ICD-10-CM | POA: Diagnosis not present

## 2018-08-27 DIAGNOSIS — Z433 Encounter for attention to colostomy: Secondary | ICD-10-CM | POA: Diagnosis not present

## 2018-08-27 DIAGNOSIS — Z87440 Personal history of urinary (tract) infections: Secondary | ICD-10-CM | POA: Diagnosis not present

## 2018-08-30 DIAGNOSIS — I10 Essential (primary) hypertension: Secondary | ICD-10-CM | POA: Diagnosis not present

## 2018-08-30 DIAGNOSIS — Z433 Encounter for attention to colostomy: Secondary | ICD-10-CM | POA: Diagnosis not present

## 2018-08-30 DIAGNOSIS — G309 Alzheimer's disease, unspecified: Secondary | ICD-10-CM | POA: Diagnosis not present

## 2018-08-30 DIAGNOSIS — F0280 Dementia in other diseases classified elsewhere without behavioral disturbance: Secondary | ICD-10-CM | POA: Diagnosis not present

## 2018-08-30 DIAGNOSIS — Z87440 Personal history of urinary (tract) infections: Secondary | ICD-10-CM | POA: Diagnosis not present

## 2018-09-15 ENCOUNTER — Other Ambulatory Visit: Payer: Self-pay

## 2018-09-15 ENCOUNTER — Emergency Department (HOSPITAL_COMMUNITY): Payer: Medicare Other

## 2018-09-15 ENCOUNTER — Emergency Department (HOSPITAL_COMMUNITY)
Admission: EM | Admit: 2018-09-15 | Discharge: 2018-09-15 | Disposition: A | Discharge status: Home / Self Care | Payer: Medicare Other | Attending: Emergency Medicine | Admitting: Emergency Medicine

## 2018-09-15 ENCOUNTER — Encounter (HOSPITAL_COMMUNITY): Payer: Self-pay

## 2018-09-15 DIAGNOSIS — M545 Low back pain, unspecified: Secondary | ICD-10-CM

## 2018-09-15 DIAGNOSIS — F0390 Unspecified dementia without behavioral disturbance: Secondary | ICD-10-CM | POA: Insufficient documentation

## 2018-09-15 DIAGNOSIS — R4020 Unspecified coma: Secondary | ICD-10-CM | POA: Diagnosis not present

## 2018-09-15 DIAGNOSIS — R410 Disorientation, unspecified: Secondary | ICD-10-CM | POA: Diagnosis not present

## 2018-09-15 DIAGNOSIS — R457 State of emotional shock and stress, unspecified: Secondary | ICD-10-CM | POA: Diagnosis not present

## 2018-09-15 DIAGNOSIS — Z79899 Other long term (current) drug therapy: Secondary | ICD-10-CM | POA: Insufficient documentation

## 2018-09-15 DIAGNOSIS — M5136 Other intervertebral disc degeneration, lumbar region: Secondary | ICD-10-CM | POA: Diagnosis not present

## 2018-09-15 DIAGNOSIS — I10 Essential (primary) hypertension: Secondary | ICD-10-CM | POA: Diagnosis not present

## 2018-09-15 DIAGNOSIS — R262 Difficulty in walking, not elsewhere classified: Secondary | ICD-10-CM | POA: Diagnosis not present

## 2018-09-15 DIAGNOSIS — M47816 Spondylosis without myelopathy or radiculopathy, lumbar region: Secondary | ICD-10-CM | POA: Diagnosis not present

## 2018-09-15 DIAGNOSIS — R4182 Altered mental status, unspecified: Secondary | ICD-10-CM | POA: Diagnosis not present

## 2018-09-15 DIAGNOSIS — M48061 Spinal stenosis, lumbar region without neurogenic claudication: Secondary | ICD-10-CM | POA: Diagnosis not present

## 2018-09-15 DIAGNOSIS — M479 Spondylosis, unspecified: Secondary | ICD-10-CM | POA: Diagnosis not present

## 2018-09-15 LAB — URINALYSIS, COMPLETE (UACMP) WITH MICROSCOPIC
Bacteria, UA: NONE SEEN
Bilirubin Urine: NEGATIVE
Glucose, UA: NEGATIVE mg/dL
Hgb urine dipstick: NEGATIVE
Ketones, ur: NEGATIVE mg/dL
Leukocytes, UA: NEGATIVE
Nitrite: NEGATIVE
PH: 8 (ref 5.0–8.0)
Protein, ur: NEGATIVE mg/dL
Specific Gravity, Urine: 1.006 (ref 1.005–1.030)

## 2018-09-15 LAB — COMPREHENSIVE METABOLIC PANEL
ALT: 16 U/L (ref 0–44)
ANION GAP: 7 (ref 5–15)
AST: 27 U/L (ref 15–41)
Albumin: 4 g/dL (ref 3.5–5.0)
Alkaline Phosphatase: 81 U/L (ref 38–126)
BILIRUBIN TOTAL: 1.3 mg/dL — AB (ref 0.3–1.2)
BUN: 11 mg/dL (ref 8–23)
CO2: 30 mmol/L (ref 22–32)
Calcium: 8.9 mg/dL (ref 8.9–10.3)
Chloride: 102 mmol/L (ref 98–111)
Creatinine, Ser: 0.94 mg/dL (ref 0.61–1.24)
GFR calc Af Amer: >60 mL/min (ref >60)
Glucose, Bld: 85 mg/dL (ref 70–99)
Potassium: 3.7 mmol/L (ref 3.5–5.1)
Sodium: 139 mmol/L (ref 135–145)
TOTAL PROTEIN: 7.9 g/dL (ref 6.5–8.1)

## 2018-09-15 LAB — CBC WITH DIFFERENTIAL/PLATELET
Abs Immature Granulocytes: 0.01 10*3/uL (ref 0.00–0.07)
BASOS PCT: 1 %
Basophils Absolute: 0 10*3/uL (ref 0.0–0.1)
Eosinophils Absolute: 0.2 10*3/uL (ref 0.0–0.5)
Eosinophils Relative: 4 %
HCT: 36.5 % — ABNORMAL LOW (ref 39.0–52.0)
Hemoglobin: 11.4 g/dL — ABNORMAL LOW (ref 13.0–17.0)
Immature Granulocytes: 0 %
LYMPHS ABS: 2.2 10*3/uL (ref 0.7–4.0)
Lymphocytes Relative: 41 %
MCH: 29.6 pg (ref 26.0–34.0)
MCHC: 31.2 g/dL (ref 30.0–36.0)
MCV: 94.8 fL (ref 80.0–100.0)
Monocytes Absolute: 0.5 10*3/uL (ref 0.1–1.0)
Monocytes Relative: 9 %
Neutro Abs: 2.5 10*3/uL (ref 1.7–7.7)
Neutrophils Relative %: 45 %
Platelets: 187 10*3/uL (ref 150–400)
RBC: 3.85 MIL/uL — ABNORMAL LOW (ref 4.22–5.81)
RDW: 12.9 % (ref 11.5–15.5)
WBC: 5.4 10*3/uL (ref 4.0–10.5)
nRBC: 0 % (ref 0.0–0.2)

## 2018-09-15 LAB — CBG MONITORING, ED: GLUCOSE-CAPILLARY: 100 mg/dL — AB (ref 70–99)

## 2018-09-15 MED ORDER — SODIUM CHLORIDE 0.9 % IV BOLUS
500.0000 mL | Freq: Once | INTRAVENOUS | Status: AC
  Administered 2018-09-15: 500 mL via INTRAVENOUS

## 2018-09-15 MED ORDER — AMLODIPINE BESYLATE 5 MG PO TABS
10.0000 mg | ORAL_TABLET | Freq: Once | ORAL | Status: AC
  Administered 2018-09-15: 10 mg via ORAL
  Filled 2018-09-15: qty 2

## 2018-09-15 NOTE — Progress Notes (Signed)
CSW met with patient and patient's daughter-in-law Delvis Kau) at bedside to discuss SNF. Patient has a history of dementia and was only oriented to self. Patient's daughter in law reported that patient resides with his 81 year old daughter and wanders when left unsupervised. Patient's daughter in law reported that patient wandered last week and they had to find him. Patient's daughter in law reported that she has been unable to place patient in a facility from the community because he doesn't have a POA and she doesn't access to his financial information. Patient's daughter in law reported that patient receives SSI but she is unaware how much he receives, noting patient's daughter reported that she doesn't know how much he receives either. Patient's daughter in law reported that patient's daughter called her and said patient wasn't feeling well or talking much and she brought him to the ED. Patient's daughter in law reported that patient has home health care but she feels patient needs more assistance. Patient's daughter in law reported that patient needs assistance with bathing, eating and changing colostomy bag. Patient's daughter in law reported that patient's daughter is not capable of helping patient. Patient's daughter in law reported that she wishes she could just take patient home and care for him but that she just recently moved in with someone due to her disabilities. Patient's daughter in law reported that if patient is discharged today she can take patient home and care for him temporarily. CSW informed patient's daughter in law that CSW is unable to place patient into a facility from the ED. CSW provided patient's daughter in law with SNF and ALF resources. CSW provided patient's daughter in law with [facility] services and contact information. CSW provided patient's daughter in law with [facility] DSS APS after hours contact information to make a report regarding concerns for  patient's safety when unsupervised and other care needs. Patient's daughter in law reported that she would make an APS report today so they could follow up and assist with placement from the community.   CSW signing off. Patient to discharge home with daughter in law who reports she can care for patient at discharge and plans to follow up with APS to make a report.   Abundio Miu, [place] Worker [facility] Cell#: [phone]

## 2018-09-15 NOTE — ED Notes (Signed)
[facility] Expected date:  Expected time:  Means of arrival:  Comments: Difficulty walking, dementia

## 2018-09-15 NOTE — Discharge Instructions (Addendum)
MRI of brain showed no blood clot or tumor.  He does have multiple problems with his lower back which will contribute to his ability to walk.  He is not a surgical candidate to operate on his back.  Follow-up with his primary care doctor.

## 2018-09-15 NOTE — ED Notes (Signed)
Pt. Ambulated around in the room back to his bed with assistance with right leg shaking as he was ambulating. Pt. Assisted back to bed and MD. Notified.

## 2018-09-15 NOTE — ED Notes (Signed)
ED Provider at bedside. 

## 2018-09-15 NOTE — ED Provider Notes (Signed)
[facility]-EMERGENCY DEPT Provider Note   CSN: [id number] Arrival date & time: 09/15/18  1152   LEVEL 5 CAVEAT - DEMENTIA   History   Chief Complaint Chief Complaint  Patient presents with  . Dementia  . Altered Mental Status    HPI Wyatt King is a 83 y.o. male.  HPI  [age] year old male presents with shaking and confusion.  The patient has significant dementia and is unable to provide a history.  I discussed with his daughter, Wyatt King, who he lives with and she states that he was having significant shaking whenever he would try and stand up today.  No falls though when the ambulance tried to get him to walk he did seem to go down to his knees but did not suffer any injuries.  He is awake and alert when he is shaking which is diffuse.  He has had shaking episodes like this but never this severe.  He also has a little more confused than typical.  Patient's other daughter is currently at the bedside and states that the way he is behaving right now is the way be he typically behaves at night when his mental status is usually worse.  There have been no fevers or vomiting.  No cough or other acute abnormalities.  He seemed to be more at his baseline yesterday.  Past Medical History:  Diagnosis Date  . Memory disorder 07/07/2016  . Vitamin B12 deficiency 07/07/2016    Patient Active Problem List   Diagnosis Date Noted  . Memory disorder 07/07/2016  . Vitamin B12 deficiency 07/07/2016    Past Surgical History:  Procedure Laterality Date  . COLOSTOMY          Home Medications    Prior to Admission medications   Medication Sig Start Date End Date Taking? Authorizing Provider  amLODipine (NORVASC) 10 MG tablet Take 10 mg by mouth daily. 12/02/17  Yes [provider]  donepezil (ARICEPT) 10 MG tablet Take 10 mg by mouth at bedtime. 12/02/17  Yes [provider]  latanoprost (XALATAN) 0.005 % ophthalmic solution Place 1 drop into both  eyes at bedtime. 12/02/17  Yes [provider]  Multiple Vitamin (MULTIVITAMIN) tablet Take 1 tablet by mouth daily.   Yes [provider]  cephALEXin (KEFLEX) 500 MG capsule Take 1 capsule (500 mg total) by mouth 4 (four) times daily. Patient not taking: Reported on 09/15/2018 04/20/18   Bethann BerkshireZammit, Joseph, MD    Family History Family History  Problem Relation Age of Onset  . Stroke Father     Social History Social History   Tobacco Use  . Smoking status: Never Smoker  . Smokeless tobacco: Never Used  Substance Use Topics  . Alcohol use: No  . Drug use: No     Allergies   Patient has no known allergies.   Review of Systems Review of Systems  Unable to perform ROS: Dementia     Physical Exam Updated Vital Signs BP (!) 131/31   Pulse 69   Temp 97.8 F (36.6 C) (Oral)   Resp (!) 22   Ht 5\' 2"  (1.575 m)   Wt 95.3 kg   SpO2 100%   BMI 38.43 kg/m   Physical Exam Vitals signs and nursing note reviewed.  Constitutional:      Appearance: He is well-developed.  HENT:     Head: Normocephalic and atraumatic.     Right Ear: External ear normal.     Left Ear: External ear  normal.     Nose: Nose normal.  Eyes:     General:        Right eye: No discharge.        Left eye: No discharge.  Neck:     Musculoskeletal: Neck supple.  Cardiovascular:     Rate and Rhythm: Normal rate and regular rhythm.     Heart sounds: Normal heart sounds.  Pulmonary:     Effort: Pulmonary effort is normal.     Breath sounds: Normal breath sounds.  Abdominal:     Palpations: Abdomen is soft.     Tenderness: There is no abdominal tenderness.  Skin:    General: Skin is warm and dry.  Neurological:     Mental Status: He is alert. He is disoriented.     Comments: Awake, alert, oriented to person and place. No slurred speech or facial droop. 5/5 strength in all 4 extremities.   Psychiatric:     Comments: At times, the patient will start to appear to get tearful and this  seems to come and go during different encounters with nurse and myself.      ED Treatments / Results  Labs (all labs ordered are listed, but only abnormal results are displayed) Labs Reviewed  COMPREHENSIVE METABOLIC PANEL - Abnormal; Notable for the following components:      Result Value   Total Bilirubin 1.3 (*)    All other components within normal limits  CBC WITH DIFFERENTIAL/PLATELET - Abnormal; Notable for the following components:   RBC 3.85 (*)    Hemoglobin 11.4 (*)    HCT 36.5 (*)    All other components within normal limits  URINALYSIS, COMPLETE (UACMP) WITH MICROSCOPIC - Abnormal; Notable for the following components:   Color, Urine STRAW (*)    All other components within normal limits  CBG MONITORING, ED - Abnormal; Notable for the following components:   Glucose-Capillary 100 (*)    All other components within normal limits    EKG EKG Interpretation  Date/Time:  Sunday September 15 2018 13:10:38 EST Ventricular Rate:  60 PR Interval:    QRS Duration: 104 QT Interval:  432 QTC Calculation: 432 R Axis:   71 Text Interpretation:  Sinus rhythm no significant change since Aug 2019 Confirmed by Pricilla Loveless [id number]) on 09/15/2018 3:14:06 PM   Radiology Dg Chest 2 View  Result Date: 09/15/2018 CLINICAL DATA:  Mental status changes. EXAM: CHEST - 2 VIEW COMPARISON:  04/20/2018 FINDINGS: The heart size and mediastinal contours are within normal limits. Both lungs are clear. The visualized skeletal structures are unremarkable. IMPRESSION: No active cardiopulmonary disease. Electronically Signed   By: Kennith Center M.D.   On: 09/15/2018 13:44   Ct Head Wo Contrast  Result Date: 09/15/2018 CLINICAL DATA:  Altered level of consciousness. EXAM: CT HEAD WITHOUT CONTRAST TECHNIQUE: Contiguous axial images were obtained from the base of the skull through the vertex without intravenous contrast. COMPARISON:  CT scan of April 20, 2018. FINDINGS: Brain: Mild chronic  ischemic white matter disease is noted. No mass effect or midline shift is noted. Ventricular size is within normal limits. There is no evidence of mass lesion, hemorrhage or acute infarction. Vascular: No hyperdense vessel or unexpected calcification. Skull: Normal. Negative for fracture or focal lesion. Sinuses/Orbits: No acute finding. Other: None. IMPRESSION: Mild chronic ischemic white matter disease. No acute intracranial abnormality seen. Electronically Signed   By: Lupita Raider, M.D.   On: 09/15/2018 14:34    Procedures Procedures (  including critical care time)  Medications Ordered in ED Medications  amLODipine (NORVASC) tablet 10 mg (has no administration in time range)  sodium chloride 0.9 % bolus 500 mL (0 mLs Intravenous Stopped 09/15/18 1500)     Initial Impression / Assessment and Plan / ED Course  I have reviewed the triage vital signs and the nursing notes.  Pertinent labs & imaging results that were available during my care of the patient were reviewed by me and considered in my medical decision making (see chart for details).     There are no clear findings on work-up.  The patient has a little bit of difficulty lifting his legs off of the stretcher but technically his strength is 5/5.  He endorses no back pain when originally asked and his back is palpated.  However when he was tried to ambulate, he had a lot of difficulty in his right leg Shaking when he tried to walk.  He was unable to walk without assistance and he typically walks alone.  I think this is probably mostly pain related.  With the dementia it is very hard to tell.  I still cannot produce any back tenderness.  At this point he could not go home with the daughter in this condition but I think if he gets better pain control and has negative MRI to rule out stroke and spinal cord emergency, he could go home with pain control if he is able to ambulate in the ED.  Otherwise he would need admission.  Care transferred  to Dr. Adriana Simas.  Final Clinical Impressions(s) / ED Diagnoses   Final diagnoses:  Acute low back pain, unspecified back pain laterality, unspecified whether sciatica present    ED Discharge Orders    None       Pricilla Loveless, MD 09/15/18 1616

## 2018-09-15 NOTE — ED Triage Notes (Signed)
AOx1 (to self), Patient comes from home but lives with daughter. Patient arrived via [facility]. Patient is normally ambulatory however patient has not been able to ambulate nearly as much as family states. Patient reports no pain, is begging to become teary eyed with [facility]. Patient has had several emotional episodes while in route as well.   Unsure of baseline.

## 2018-09-15 NOTE — ED Provider Notes (Signed)
Discussed test results of MRI of brain and lumbar spine with patient and family member.  Patient is not a candidate for surgery.  Will recommend primary care follow-up.   Donnetta Hutching, MD 09/15/18 2230

## 2018-09-17 DIAGNOSIS — E538 Deficiency of other specified B group vitamins: Secondary | ICD-10-CM | POA: Diagnosis not present

## 2018-09-17 DIAGNOSIS — D519 Vitamin B12 deficiency anemia, unspecified: Secondary | ICD-10-CM | POA: Diagnosis not present

## 2018-09-17 DIAGNOSIS — I10 Essential (primary) hypertension: Secondary | ICD-10-CM | POA: Diagnosis not present

## 2018-09-17 DIAGNOSIS — R413 Other amnesia: Secondary | ICD-10-CM | POA: Diagnosis not present

## 2018-09-17 DIAGNOSIS — F0390 Unspecified dementia without behavioral disturbance: Secondary | ICD-10-CM | POA: Diagnosis not present

## 2018-11-06 ENCOUNTER — Ambulatory Visit: Payer: Medicare Other | Admitting: Neurology

## 2018-11-06 ENCOUNTER — Telehealth: Payer: Self-pay | Admitting: Neurology

## 2018-11-06 NOTE — Telephone Encounter (Signed)
This patient did not show for a revisit appointment today. 

## 2018-11-07 ENCOUNTER — Encounter: Payer: Self-pay | Admitting: Neurology

## 2018-11-12 DIAGNOSIS — F0390 Unspecified dementia without behavioral disturbance: Secondary | ICD-10-CM | POA: Diagnosis not present

## 2018-11-12 DIAGNOSIS — M6281 Muscle weakness (generalized): Secondary | ICD-10-CM | POA: Diagnosis not present

## 2018-11-12 DIAGNOSIS — I10 Essential (primary) hypertension: Secondary | ICD-10-CM | POA: Diagnosis not present

## 2018-11-12 DIAGNOSIS — R262 Difficulty in walking, not elsewhere classified: Secondary | ICD-10-CM | POA: Diagnosis not present

## 2018-11-12 DIAGNOSIS — R41841 Cognitive communication deficit: Secondary | ICD-10-CM | POA: Diagnosis not present

## 2018-11-12 DIAGNOSIS — R2681 Unsteadiness on feet: Secondary | ICD-10-CM | POA: Diagnosis not present

## 2018-11-12 DIAGNOSIS — F0280 Dementia in other diseases classified elsewhere without behavioral disturbance: Secondary | ICD-10-CM | POA: Diagnosis not present

## 2018-11-13 DIAGNOSIS — R262 Difficulty in walking, not elsewhere classified: Secondary | ICD-10-CM | POA: Diagnosis not present

## 2018-11-13 DIAGNOSIS — M6281 Muscle weakness (generalized): Secondary | ICD-10-CM | POA: Diagnosis not present

## 2018-11-13 DIAGNOSIS — F0280 Dementia in other diseases classified elsewhere without behavioral disturbance: Secondary | ICD-10-CM | POA: Diagnosis not present

## 2018-11-13 DIAGNOSIS — I10 Essential (primary) hypertension: Secondary | ICD-10-CM | POA: Diagnosis not present

## 2018-11-13 DIAGNOSIS — F0390 Unspecified dementia without behavioral disturbance: Secondary | ICD-10-CM | POA: Diagnosis not present

## 2018-11-13 DIAGNOSIS — R2681 Unsteadiness on feet: Secondary | ICD-10-CM | POA: Diagnosis not present

## 2018-11-14 DIAGNOSIS — F0151 Vascular dementia with behavioral disturbance: Secondary | ICD-10-CM | POA: Diagnosis not present

## 2018-11-14 DIAGNOSIS — I10 Essential (primary) hypertension: Secondary | ICD-10-CM | POA: Diagnosis not present

## 2018-11-14 DIAGNOSIS — F0280 Dementia in other diseases classified elsewhere without behavioral disturbance: Secondary | ICD-10-CM | POA: Diagnosis not present

## 2018-11-14 DIAGNOSIS — R269 Unspecified abnormalities of gait and mobility: Secondary | ICD-10-CM | POA: Diagnosis not present

## 2018-11-14 DIAGNOSIS — R2681 Unsteadiness on feet: Secondary | ICD-10-CM | POA: Diagnosis not present

## 2018-11-14 DIAGNOSIS — F0390 Unspecified dementia without behavioral disturbance: Secondary | ICD-10-CM | POA: Diagnosis not present

## 2018-11-14 DIAGNOSIS — R262 Difficulty in walking, not elsewhere classified: Secondary | ICD-10-CM | POA: Diagnosis not present

## 2018-11-14 DIAGNOSIS — M6281 Muscle weakness (generalized): Secondary | ICD-10-CM | POA: Diagnosis not present

## 2018-11-14 DIAGNOSIS — D649 Anemia, unspecified: Secondary | ICD-10-CM | POA: Diagnosis not present

## 2018-11-15 DIAGNOSIS — R2681 Unsteadiness on feet: Secondary | ICD-10-CM | POA: Diagnosis not present

## 2018-11-15 DIAGNOSIS — F0390 Unspecified dementia without behavioral disturbance: Secondary | ICD-10-CM | POA: Diagnosis not present

## 2018-11-15 DIAGNOSIS — M6281 Muscle weakness (generalized): Secondary | ICD-10-CM | POA: Diagnosis not present

## 2018-11-15 DIAGNOSIS — I10 Essential (primary) hypertension: Secondary | ICD-10-CM | POA: Diagnosis not present

## 2018-11-15 DIAGNOSIS — F0280 Dementia in other diseases classified elsewhere without behavioral disturbance: Secondary | ICD-10-CM | POA: Diagnosis not present

## 2018-11-15 DIAGNOSIS — R262 Difficulty in walking, not elsewhere classified: Secondary | ICD-10-CM | POA: Diagnosis not present

## 2018-11-16 DIAGNOSIS — F0280 Dementia in other diseases classified elsewhere without behavioral disturbance: Secondary | ICD-10-CM | POA: Diagnosis not present

## 2018-11-16 DIAGNOSIS — F0390 Unspecified dementia without behavioral disturbance: Secondary | ICD-10-CM | POA: Diagnosis not present

## 2018-11-16 DIAGNOSIS — M6281 Muscle weakness (generalized): Secondary | ICD-10-CM | POA: Diagnosis not present

## 2018-11-16 DIAGNOSIS — R262 Difficulty in walking, not elsewhere classified: Secondary | ICD-10-CM | POA: Diagnosis not present

## 2018-11-16 DIAGNOSIS — I10 Essential (primary) hypertension: Secondary | ICD-10-CM | POA: Diagnosis not present

## 2018-11-16 DIAGNOSIS — R2681 Unsteadiness on feet: Secondary | ICD-10-CM | POA: Diagnosis not present

## 2018-11-18 DIAGNOSIS — M6281 Muscle weakness (generalized): Secondary | ICD-10-CM | POA: Diagnosis not present

## 2018-11-18 DIAGNOSIS — R262 Difficulty in walking, not elsewhere classified: Secondary | ICD-10-CM | POA: Diagnosis not present

## 2018-11-18 DIAGNOSIS — R2681 Unsteadiness on feet: Secondary | ICD-10-CM | POA: Diagnosis not present

## 2018-11-18 DIAGNOSIS — I10 Essential (primary) hypertension: Secondary | ICD-10-CM | POA: Diagnosis not present

## 2018-11-18 DIAGNOSIS — F0280 Dementia in other diseases classified elsewhere without behavioral disturbance: Secondary | ICD-10-CM | POA: Diagnosis not present

## 2018-11-18 DIAGNOSIS — F0390 Unspecified dementia without behavioral disturbance: Secondary | ICD-10-CM | POA: Diagnosis not present

## 2018-11-19 DIAGNOSIS — R2681 Unsteadiness on feet: Secondary | ICD-10-CM | POA: Diagnosis not present

## 2018-11-19 DIAGNOSIS — I10 Essential (primary) hypertension: Secondary | ICD-10-CM | POA: Diagnosis not present

## 2018-11-19 DIAGNOSIS — R262 Difficulty in walking, not elsewhere classified: Secondary | ICD-10-CM | POA: Diagnosis not present

## 2018-11-19 DIAGNOSIS — F0390 Unspecified dementia without behavioral disturbance: Secondary | ICD-10-CM | POA: Diagnosis not present

## 2018-11-19 DIAGNOSIS — F0280 Dementia in other diseases classified elsewhere without behavioral disturbance: Secondary | ICD-10-CM | POA: Diagnosis not present

## 2018-11-19 DIAGNOSIS — M6281 Muscle weakness (generalized): Secondary | ICD-10-CM | POA: Diagnosis not present

## 2018-11-20 DIAGNOSIS — F0390 Unspecified dementia without behavioral disturbance: Secondary | ICD-10-CM | POA: Diagnosis not present

## 2018-11-20 DIAGNOSIS — I10 Essential (primary) hypertension: Secondary | ICD-10-CM | POA: Diagnosis not present

## 2018-11-20 DIAGNOSIS — M6281 Muscle weakness (generalized): Secondary | ICD-10-CM | POA: Diagnosis not present

## 2018-11-20 DIAGNOSIS — R2681 Unsteadiness on feet: Secondary | ICD-10-CM | POA: Diagnosis not present

## 2018-11-20 DIAGNOSIS — R262 Difficulty in walking, not elsewhere classified: Secondary | ICD-10-CM | POA: Diagnosis not present

## 2018-11-20 DIAGNOSIS — F0280 Dementia in other diseases classified elsewhere without behavioral disturbance: Secondary | ICD-10-CM | POA: Diagnosis not present

## 2018-11-21 DIAGNOSIS — F0390 Unspecified dementia without behavioral disturbance: Secondary | ICD-10-CM | POA: Diagnosis not present

## 2018-11-21 DIAGNOSIS — M6281 Muscle weakness (generalized): Secondary | ICD-10-CM | POA: Diagnosis not present

## 2018-11-21 DIAGNOSIS — R262 Difficulty in walking, not elsewhere classified: Secondary | ICD-10-CM | POA: Diagnosis not present

## 2018-11-21 DIAGNOSIS — I10 Essential (primary) hypertension: Secondary | ICD-10-CM | POA: Diagnosis not present

## 2018-11-21 DIAGNOSIS — F0280 Dementia in other diseases classified elsewhere without behavioral disturbance: Secondary | ICD-10-CM | POA: Diagnosis not present

## 2018-11-21 DIAGNOSIS — R2681 Unsteadiness on feet: Secondary | ICD-10-CM | POA: Diagnosis not present

## 2018-11-22 DIAGNOSIS — R2681 Unsteadiness on feet: Secondary | ICD-10-CM | POA: Diagnosis not present

## 2018-11-22 DIAGNOSIS — R262 Difficulty in walking, not elsewhere classified: Secondary | ICD-10-CM | POA: Diagnosis not present

## 2018-11-22 DIAGNOSIS — I10 Essential (primary) hypertension: Secondary | ICD-10-CM | POA: Diagnosis not present

## 2018-11-22 DIAGNOSIS — M6281 Muscle weakness (generalized): Secondary | ICD-10-CM | POA: Diagnosis not present

## 2018-11-22 DIAGNOSIS — F0280 Dementia in other diseases classified elsewhere without behavioral disturbance: Secondary | ICD-10-CM | POA: Diagnosis not present

## 2018-11-22 DIAGNOSIS — F0390 Unspecified dementia without behavioral disturbance: Secondary | ICD-10-CM | POA: Diagnosis not present

## 2018-11-23 DIAGNOSIS — R262 Difficulty in walking, not elsewhere classified: Secondary | ICD-10-CM | POA: Diagnosis not present

## 2018-11-23 DIAGNOSIS — R2681 Unsteadiness on feet: Secondary | ICD-10-CM | POA: Diagnosis not present

## 2018-11-23 DIAGNOSIS — F0390 Unspecified dementia without behavioral disturbance: Secondary | ICD-10-CM | POA: Diagnosis not present

## 2018-11-23 DIAGNOSIS — M6281 Muscle weakness (generalized): Secondary | ICD-10-CM | POA: Diagnosis not present

## 2018-11-23 DIAGNOSIS — F0280 Dementia in other diseases classified elsewhere without behavioral disturbance: Secondary | ICD-10-CM | POA: Diagnosis not present

## 2018-11-23 DIAGNOSIS — I10 Essential (primary) hypertension: Secondary | ICD-10-CM | POA: Diagnosis not present

## 2018-11-25 DIAGNOSIS — F0390 Unspecified dementia without behavioral disturbance: Secondary | ICD-10-CM | POA: Diagnosis not present

## 2018-11-25 DIAGNOSIS — R2681 Unsteadiness on feet: Secondary | ICD-10-CM | POA: Diagnosis not present

## 2018-11-25 DIAGNOSIS — R262 Difficulty in walking, not elsewhere classified: Secondary | ICD-10-CM | POA: Diagnosis not present

## 2018-11-25 DIAGNOSIS — M6281 Muscle weakness (generalized): Secondary | ICD-10-CM | POA: Diagnosis not present

## 2018-11-25 DIAGNOSIS — F0280 Dementia in other diseases classified elsewhere without behavioral disturbance: Secondary | ICD-10-CM | POA: Diagnosis not present

## 2018-11-25 DIAGNOSIS — I10 Essential (primary) hypertension: Secondary | ICD-10-CM | POA: Diagnosis not present

## 2018-11-26 DIAGNOSIS — M6281 Muscle weakness (generalized): Secondary | ICD-10-CM | POA: Diagnosis not present

## 2018-11-26 DIAGNOSIS — R262 Difficulty in walking, not elsewhere classified: Secondary | ICD-10-CM | POA: Diagnosis not present

## 2018-11-26 DIAGNOSIS — F0390 Unspecified dementia without behavioral disturbance: Secondary | ICD-10-CM | POA: Diagnosis not present

## 2018-11-26 DIAGNOSIS — I10 Essential (primary) hypertension: Secondary | ICD-10-CM | POA: Diagnosis not present

## 2018-11-26 DIAGNOSIS — R2681 Unsteadiness on feet: Secondary | ICD-10-CM | POA: Diagnosis not present

## 2018-11-26 DIAGNOSIS — F0280 Dementia in other diseases classified elsewhere without behavioral disturbance: Secondary | ICD-10-CM | POA: Diagnosis not present

## 2018-11-27 DIAGNOSIS — M6281 Muscle weakness (generalized): Secondary | ICD-10-CM | POA: Diagnosis not present

## 2018-11-27 DIAGNOSIS — F0280 Dementia in other diseases classified elsewhere without behavioral disturbance: Secondary | ICD-10-CM | POA: Diagnosis not present

## 2018-11-27 DIAGNOSIS — I10 Essential (primary) hypertension: Secondary | ICD-10-CM | POA: Diagnosis not present

## 2018-11-27 DIAGNOSIS — R2681 Unsteadiness on feet: Secondary | ICD-10-CM | POA: Diagnosis not present

## 2018-11-27 DIAGNOSIS — R262 Difficulty in walking, not elsewhere classified: Secondary | ICD-10-CM | POA: Diagnosis not present

## 2018-11-27 DIAGNOSIS — F0390 Unspecified dementia without behavioral disturbance: Secondary | ICD-10-CM | POA: Diagnosis not present

## 2018-11-28 DIAGNOSIS — I10 Essential (primary) hypertension: Secondary | ICD-10-CM | POA: Diagnosis not present

## 2018-11-28 DIAGNOSIS — R262 Difficulty in walking, not elsewhere classified: Secondary | ICD-10-CM | POA: Diagnosis not present

## 2018-11-28 DIAGNOSIS — M6281 Muscle weakness (generalized): Secondary | ICD-10-CM | POA: Diagnosis not present

## 2018-11-28 DIAGNOSIS — R2681 Unsteadiness on feet: Secondary | ICD-10-CM | POA: Diagnosis not present

## 2018-11-28 DIAGNOSIS — F0280 Dementia in other diseases classified elsewhere without behavioral disturbance: Secondary | ICD-10-CM | POA: Diagnosis not present

## 2018-11-28 DIAGNOSIS — F0390 Unspecified dementia without behavioral disturbance: Secondary | ICD-10-CM | POA: Diagnosis not present

## 2018-11-29 DIAGNOSIS — M6281 Muscle weakness (generalized): Secondary | ICD-10-CM | POA: Diagnosis not present

## 2018-11-29 DIAGNOSIS — I10 Essential (primary) hypertension: Secondary | ICD-10-CM | POA: Diagnosis not present

## 2018-11-29 DIAGNOSIS — R2681 Unsteadiness on feet: Secondary | ICD-10-CM | POA: Diagnosis not present

## 2018-11-29 DIAGNOSIS — F0390 Unspecified dementia without behavioral disturbance: Secondary | ICD-10-CM | POA: Diagnosis not present

## 2018-11-29 DIAGNOSIS — R262 Difficulty in walking, not elsewhere classified: Secondary | ICD-10-CM | POA: Diagnosis not present

## 2018-11-29 DIAGNOSIS — F0280 Dementia in other diseases classified elsewhere without behavioral disturbance: Secondary | ICD-10-CM | POA: Diagnosis not present

## 2018-12-02 DIAGNOSIS — I10 Essential (primary) hypertension: Secondary | ICD-10-CM | POA: Diagnosis not present

## 2018-12-02 DIAGNOSIS — F0280 Dementia in other diseases classified elsewhere without behavioral disturbance: Secondary | ICD-10-CM | POA: Diagnosis not present

## 2018-12-02 DIAGNOSIS — R2681 Unsteadiness on feet: Secondary | ICD-10-CM | POA: Diagnosis not present

## 2018-12-02 DIAGNOSIS — R262 Difficulty in walking, not elsewhere classified: Secondary | ICD-10-CM | POA: Diagnosis not present

## 2018-12-02 DIAGNOSIS — M6281 Muscle weakness (generalized): Secondary | ICD-10-CM | POA: Diagnosis not present

## 2018-12-02 DIAGNOSIS — F0390 Unspecified dementia without behavioral disturbance: Secondary | ICD-10-CM | POA: Diagnosis not present

## 2018-12-03 DIAGNOSIS — M6281 Muscle weakness (generalized): Secondary | ICD-10-CM | POA: Diagnosis not present

## 2018-12-03 DIAGNOSIS — R2681 Unsteadiness on feet: Secondary | ICD-10-CM | POA: Diagnosis not present

## 2018-12-03 DIAGNOSIS — I10 Essential (primary) hypertension: Secondary | ICD-10-CM | POA: Diagnosis not present

## 2018-12-03 DIAGNOSIS — F0390 Unspecified dementia without behavioral disturbance: Secondary | ICD-10-CM | POA: Diagnosis not present

## 2018-12-03 DIAGNOSIS — R262 Difficulty in walking, not elsewhere classified: Secondary | ICD-10-CM | POA: Diagnosis not present

## 2018-12-03 DIAGNOSIS — F0280 Dementia in other diseases classified elsewhere without behavioral disturbance: Secondary | ICD-10-CM | POA: Diagnosis not present

## 2018-12-04 DIAGNOSIS — F0280 Dementia in other diseases classified elsewhere without behavioral disturbance: Secondary | ICD-10-CM | POA: Diagnosis not present

## 2018-12-04 DIAGNOSIS — R41841 Cognitive communication deficit: Secondary | ICD-10-CM | POA: Diagnosis not present

## 2018-12-04 DIAGNOSIS — R2681 Unsteadiness on feet: Secondary | ICD-10-CM | POA: Diagnosis not present

## 2018-12-04 DIAGNOSIS — M6281 Muscle weakness (generalized): Secondary | ICD-10-CM | POA: Diagnosis not present

## 2018-12-04 DIAGNOSIS — I10 Essential (primary) hypertension: Secondary | ICD-10-CM | POA: Diagnosis not present

## 2018-12-04 DIAGNOSIS — F0390 Unspecified dementia without behavioral disturbance: Secondary | ICD-10-CM | POA: Diagnosis not present

## 2018-12-04 DIAGNOSIS — R262 Difficulty in walking, not elsewhere classified: Secondary | ICD-10-CM | POA: Diagnosis not present

## 2018-12-05 DIAGNOSIS — F0390 Unspecified dementia without behavioral disturbance: Secondary | ICD-10-CM | POA: Diagnosis not present

## 2018-12-05 DIAGNOSIS — I10 Essential (primary) hypertension: Secondary | ICD-10-CM | POA: Diagnosis not present

## 2018-12-05 DIAGNOSIS — R2681 Unsteadiness on feet: Secondary | ICD-10-CM | POA: Diagnosis not present

## 2018-12-05 DIAGNOSIS — M6281 Muscle weakness (generalized): Secondary | ICD-10-CM | POA: Diagnosis not present

## 2018-12-05 DIAGNOSIS — F0280 Dementia in other diseases classified elsewhere without behavioral disturbance: Secondary | ICD-10-CM | POA: Diagnosis not present

## 2018-12-05 DIAGNOSIS — R262 Difficulty in walking, not elsewhere classified: Secondary | ICD-10-CM | POA: Diagnosis not present

## 2018-12-26 DIAGNOSIS — F0151 Vascular dementia with behavioral disturbance: Secondary | ICD-10-CM | POA: Diagnosis not present

## 2018-12-26 DIAGNOSIS — I10 Essential (primary) hypertension: Secondary | ICD-10-CM | POA: Diagnosis not present

## 2018-12-26 DIAGNOSIS — R269 Unspecified abnormalities of gait and mobility: Secondary | ICD-10-CM | POA: Diagnosis not present

## 2018-12-26 DIAGNOSIS — D649 Anemia, unspecified: Secondary | ICD-10-CM | POA: Diagnosis not present

## 2019-01-21 DIAGNOSIS — D649 Anemia, unspecified: Secondary | ICD-10-CM | POA: Diagnosis not present

## 2019-01-21 DIAGNOSIS — F0151 Vascular dementia with behavioral disturbance: Secondary | ICD-10-CM | POA: Diagnosis not present

## 2019-01-21 DIAGNOSIS — F0390 Unspecified dementia without behavioral disturbance: Secondary | ICD-10-CM | POA: Diagnosis not present

## 2019-01-21 DIAGNOSIS — U071 COVID-19: Secondary | ICD-10-CM | POA: Diagnosis not present

## 2019-01-21 DIAGNOSIS — D519 Vitamin B12 deficiency anemia, unspecified: Secondary | ICD-10-CM | POA: Diagnosis not present

## 2019-01-21 DIAGNOSIS — R269 Unspecified abnormalities of gait and mobility: Secondary | ICD-10-CM | POA: Diagnosis not present

## 2019-01-21 DIAGNOSIS — Z20828 Contact with and (suspected) exposure to other viral communicable diseases: Secondary | ICD-10-CM | POA: Diagnosis not present

## 2019-01-21 DIAGNOSIS — Z933 Colostomy status: Secondary | ICD-10-CM | POA: Diagnosis not present

## 2019-01-21 DIAGNOSIS — R413 Other amnesia: Secondary | ICD-10-CM | POA: Diagnosis not present

## 2019-01-21 DIAGNOSIS — E46 Unspecified protein-calorie malnutrition: Secondary | ICD-10-CM | POA: Diagnosis not present

## 2019-01-21 DIAGNOSIS — H269 Unspecified cataract: Secondary | ICD-10-CM | POA: Diagnosis not present

## 2019-01-21 DIAGNOSIS — M5136 Other intervertebral disc degeneration, lumbar region: Secondary | ICD-10-CM | POA: Diagnosis not present

## 2019-01-21 DIAGNOSIS — I10 Essential (primary) hypertension: Secondary | ICD-10-CM | POA: Diagnosis not present

## 2019-01-21 DIAGNOSIS — E538 Deficiency of other specified B group vitamins: Secondary | ICD-10-CM | POA: Diagnosis not present

## 2019-01-23 DIAGNOSIS — F0151 Vascular dementia with behavioral disturbance: Secondary | ICD-10-CM | POA: Diagnosis not present

## 2019-01-23 DIAGNOSIS — D649 Anemia, unspecified: Secondary | ICD-10-CM | POA: Diagnosis not present

## 2019-01-23 DIAGNOSIS — R269 Unspecified abnormalities of gait and mobility: Secondary | ICD-10-CM | POA: Diagnosis not present

## 2019-01-23 DIAGNOSIS — F0390 Unspecified dementia without behavioral disturbance: Secondary | ICD-10-CM | POA: Diagnosis not present

## 2019-01-23 DIAGNOSIS — U071 COVID-19: Secondary | ICD-10-CM | POA: Diagnosis not present

## 2019-01-23 DIAGNOSIS — E46 Unspecified protein-calorie malnutrition: Secondary | ICD-10-CM | POA: Diagnosis not present

## 2019-01-23 DIAGNOSIS — I10 Essential (primary) hypertension: Secondary | ICD-10-CM | POA: Diagnosis not present

## 2019-01-29 DIAGNOSIS — I10 Essential (primary) hypertension: Secondary | ICD-10-CM | POA: Diagnosis not present

## 2019-01-29 DIAGNOSIS — U071 COVID-19: Secondary | ICD-10-CM | POA: Diagnosis not present

## 2019-01-29 DIAGNOSIS — F0151 Vascular dementia with behavioral disturbance: Secondary | ICD-10-CM | POA: Diagnosis not present

## 2019-02-13 DIAGNOSIS — D519 Vitamin B12 deficiency anemia, unspecified: Secondary | ICD-10-CM | POA: Diagnosis not present

## 2019-02-13 DIAGNOSIS — M5136 Other intervertebral disc degeneration, lumbar region: Secondary | ICD-10-CM | POA: Diagnosis not present

## 2019-02-13 DIAGNOSIS — F0390 Unspecified dementia without behavioral disturbance: Secondary | ICD-10-CM | POA: Diagnosis not present

## 2019-02-13 DIAGNOSIS — H269 Unspecified cataract: Secondary | ICD-10-CM | POA: Diagnosis not present

## 2019-02-13 DIAGNOSIS — I10 Essential (primary) hypertension: Secondary | ICD-10-CM | POA: Diagnosis not present

## 2019-02-13 DIAGNOSIS — U071 COVID-19: Secondary | ICD-10-CM | POA: Diagnosis not present

## 2019-02-13 DIAGNOSIS — Z933 Colostomy status: Secondary | ICD-10-CM | POA: Diagnosis not present

## 2019-02-21 DIAGNOSIS — M5136 Other intervertebral disc degeneration, lumbar region: Secondary | ICD-10-CM | POA: Diagnosis not present

## 2019-02-21 DIAGNOSIS — F0390 Unspecified dementia without behavioral disturbance: Secondary | ICD-10-CM | POA: Diagnosis not present

## 2019-02-21 DIAGNOSIS — I10 Essential (primary) hypertension: Secondary | ICD-10-CM | POA: Diagnosis not present

## 2019-02-21 DIAGNOSIS — U071 COVID-19: Secondary | ICD-10-CM | POA: Diagnosis not present

## 2019-02-28 DIAGNOSIS — U071 COVID-19: Secondary | ICD-10-CM | POA: Diagnosis not present

## 2019-02-28 DIAGNOSIS — F0390 Unspecified dementia without behavioral disturbance: Secondary | ICD-10-CM | POA: Diagnosis not present

## 2019-02-28 DIAGNOSIS — Z933 Colostomy status: Secondary | ICD-10-CM | POA: Diagnosis not present

## 2019-03-07 DIAGNOSIS — U071 COVID-19: Secondary | ICD-10-CM | POA: Diagnosis not present

## 2019-03-07 DIAGNOSIS — R269 Unspecified abnormalities of gait and mobility: Secondary | ICD-10-CM | POA: Diagnosis not present

## 2019-03-07 DIAGNOSIS — I10 Essential (primary) hypertension: Secondary | ICD-10-CM | POA: Diagnosis not present

## 2019-03-07 DIAGNOSIS — D519 Vitamin B12 deficiency anemia, unspecified: Secondary | ICD-10-CM | POA: Diagnosis not present

## 2019-03-27 DIAGNOSIS — Z03818 Encounter for observation for suspected exposure to other biological agents ruled out: Secondary | ICD-10-CM | POA: Diagnosis not present

## 2019-04-09 DIAGNOSIS — R41841 Cognitive communication deficit: Secondary | ICD-10-CM | POA: Diagnosis not present

## 2019-04-09 DIAGNOSIS — F0390 Unspecified dementia without behavioral disturbance: Secondary | ICD-10-CM | POA: Diagnosis not present

## 2019-04-18 DIAGNOSIS — L603 Nail dystrophy: Secondary | ICD-10-CM | POA: Diagnosis not present

## 2019-04-18 DIAGNOSIS — I739 Peripheral vascular disease, unspecified: Secondary | ICD-10-CM | POA: Diagnosis not present

## 2019-04-18 DIAGNOSIS — B351 Tinea unguium: Secondary | ICD-10-CM | POA: Diagnosis not present

## 2019-05-09 DIAGNOSIS — F0391 Unspecified dementia with behavioral disturbance: Secondary | ICD-10-CM | POA: Diagnosis not present

## 2019-05-23 DIAGNOSIS — I10 Essential (primary) hypertension: Secondary | ICD-10-CM | POA: Diagnosis not present

## 2019-05-29 DIAGNOSIS — M6281 Muscle weakness (generalized): Secondary | ICD-10-CM | POA: Diagnosis not present

## 2019-05-29 DIAGNOSIS — F0390 Unspecified dementia without behavioral disturbance: Secondary | ICD-10-CM | POA: Diagnosis not present

## 2019-05-29 DIAGNOSIS — M5136 Other intervertebral disc degeneration, lumbar region: Secondary | ICD-10-CM | POA: Diagnosis not present

## 2019-06-03 DIAGNOSIS — H40023 Open angle with borderline findings, high risk, bilateral: Secondary | ICD-10-CM | POA: Diagnosis not present

## 2019-06-03 DIAGNOSIS — H43813 Vitreous degeneration, bilateral: Secondary | ICD-10-CM | POA: Diagnosis not present

## 2019-06-03 DIAGNOSIS — H35013 Changes in retinal vascular appearance, bilateral: Secondary | ICD-10-CM | POA: Diagnosis not present

## 2019-06-03 DIAGNOSIS — Z961 Presence of intraocular lens: Secondary | ICD-10-CM | POA: Diagnosis not present

## 2019-06-10 DIAGNOSIS — Z23 Encounter for immunization: Secondary | ICD-10-CM | POA: Diagnosis not present

## 2019-06-17 DIAGNOSIS — Z1383 Encounter for screening for respiratory disorder NEC: Secondary | ICD-10-CM | POA: Diagnosis not present

## 2019-06-17 DIAGNOSIS — Z20828 Contact with and (suspected) exposure to other viral communicable diseases: Secondary | ICD-10-CM | POA: Diagnosis not present

## 2019-07-03 DIAGNOSIS — M2141 Flat foot [pes planus] (acquired), right foot: Secondary | ICD-10-CM | POA: Diagnosis not present

## 2019-07-03 DIAGNOSIS — L603 Nail dystrophy: Secondary | ICD-10-CM | POA: Diagnosis not present

## 2019-07-03 DIAGNOSIS — B351 Tinea unguium: Secondary | ICD-10-CM | POA: Diagnosis not present

## 2019-07-03 DIAGNOSIS — M2142 Flat foot [pes planus] (acquired), left foot: Secondary | ICD-10-CM | POA: Diagnosis not present

## 2019-07-03 DIAGNOSIS — I739 Peripheral vascular disease, unspecified: Secondary | ICD-10-CM | POA: Diagnosis not present

## 2019-07-15 DIAGNOSIS — Z20828 Contact with and (suspected) exposure to other viral communicable diseases: Secondary | ICD-10-CM | POA: Diagnosis not present

## 2019-07-18 DIAGNOSIS — D649 Anemia, unspecified: Secondary | ICD-10-CM | POA: Diagnosis not present

## 2019-07-18 DIAGNOSIS — I10 Essential (primary) hypertension: Secondary | ICD-10-CM | POA: Diagnosis not present

## 2019-07-18 DIAGNOSIS — Z933 Colostomy status: Secondary | ICD-10-CM | POA: Diagnosis not present

## 2019-07-18 DIAGNOSIS — F0390 Unspecified dementia without behavioral disturbance: Secondary | ICD-10-CM | POA: Diagnosis not present

## 2019-07-21 DIAGNOSIS — Z20828 Contact with and (suspected) exposure to other viral communicable diseases: Secondary | ICD-10-CM | POA: Diagnosis not present

## 2019-07-21 DIAGNOSIS — Z1383 Encounter for screening for respiratory disorder NEC: Secondary | ICD-10-CM | POA: Diagnosis not present

## 2019-08-04 DIAGNOSIS — Z1383 Encounter for screening for respiratory disorder NEC: Secondary | ICD-10-CM | POA: Diagnosis not present

## 2019-08-04 DIAGNOSIS — Z20828 Contact with and (suspected) exposure to other viral communicable diseases: Secondary | ICD-10-CM | POA: Diagnosis not present

## 2019-08-11 DIAGNOSIS — Z1383 Encounter for screening for respiratory disorder NEC: Secondary | ICD-10-CM | POA: Diagnosis not present

## 2019-08-11 DIAGNOSIS — Z20828 Contact with and (suspected) exposure to other viral communicable diseases: Secondary | ICD-10-CM | POA: Diagnosis not present

## 2019-09-07 DIAGNOSIS — Z20828 Contact with and (suspected) exposure to other viral communicable diseases: Secondary | ICD-10-CM | POA: Diagnosis not present

## 2019-09-15 DIAGNOSIS — Z1383 Encounter for screening for respiratory disorder NEC: Secondary | ICD-10-CM | POA: Diagnosis not present

## 2019-09-15 DIAGNOSIS — Z20828 Contact with and (suspected) exposure to other viral communicable diseases: Secondary | ICD-10-CM | POA: Diagnosis not present

## 2019-09-21 DIAGNOSIS — Z1383 Encounter for screening for respiratory disorder NEC: Secondary | ICD-10-CM | POA: Diagnosis not present

## 2019-09-21 DIAGNOSIS — Z20828 Contact with and (suspected) exposure to other viral communicable diseases: Secondary | ICD-10-CM | POA: Diagnosis not present

## 2019-09-23 IMAGING — CT CT HEAD W/O CM
4 of 8 series · 15 of 47 positions shown, 16 images · non-contrast
Comparison: Head CT 01/15/2018

CLINICAL DATA: Found on sidewalk.  Altered mental status.

EXAM:
CT HEAD WITHOUT CONTRAST
CT CERVICAL SPINE WITHOUT CONTRAST
TECHNIQUE: Multidetector CT imaging of the head and cervical spine was
performed following the standard protocol without intravenous
contrast. Multiplanar CT image reconstructions of the cervical spine
were also generated.

[Series 3: head without · axial · non-contrast · 0.46mm/px · z∈[-90,+70]mm · 3 of 33 slices shown, 4 images]
[im 1/33  brain]
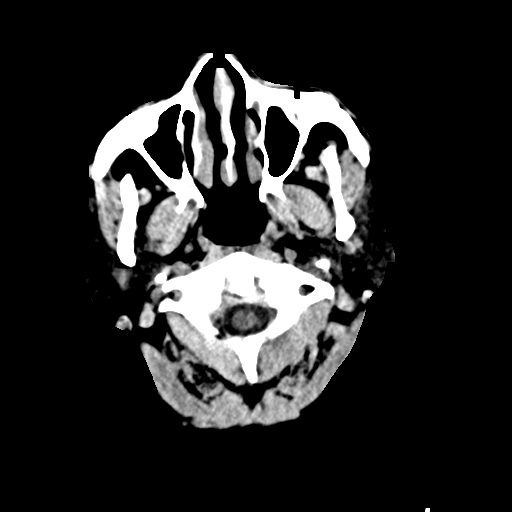
[im 1/33  bone]
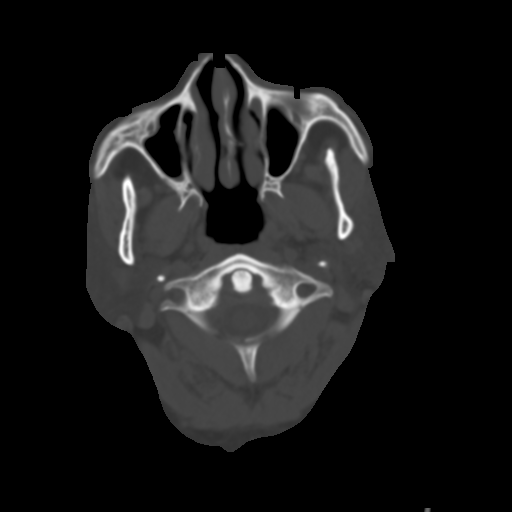
[im 17/33  brain]
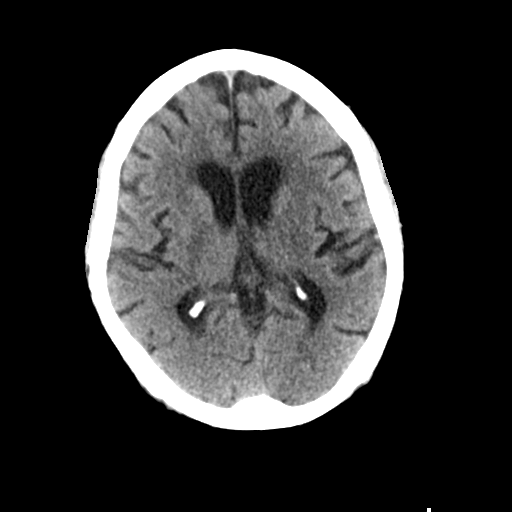
[im 33/33  brain]
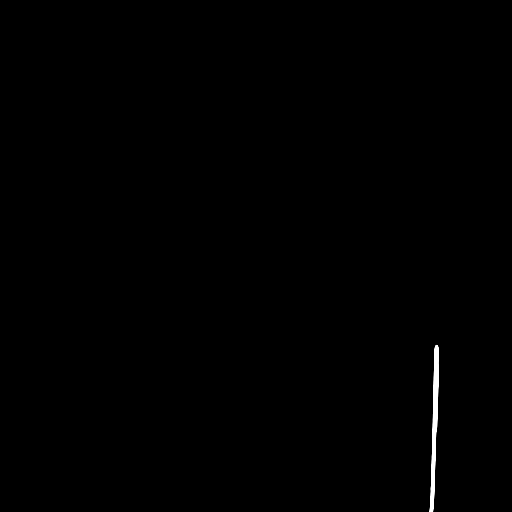

[Series 4: head bone · axial · 0.46mm/px · z∈[-70,+50]mm · 7 of 82 slices shown]
[im 11/82  bone]
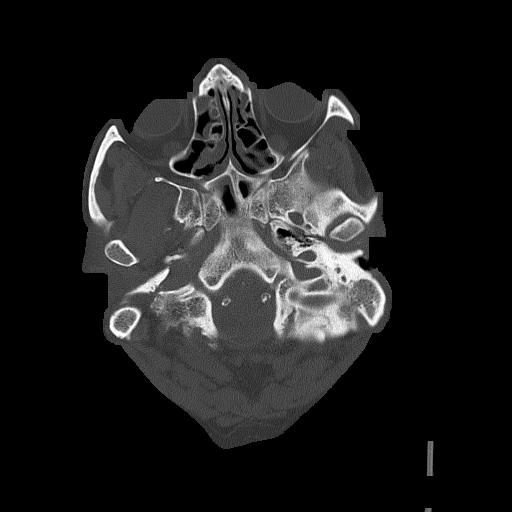
[im 21/82  bone]
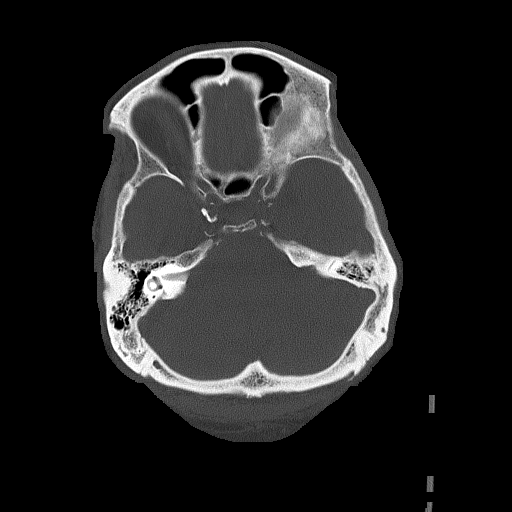
[im 31/82  bone]
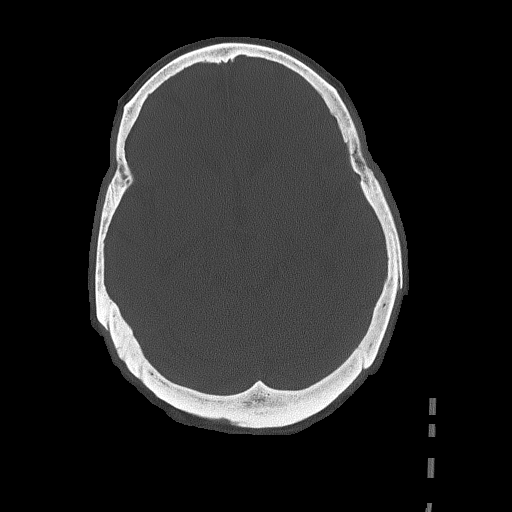
[im 41/82  bone]
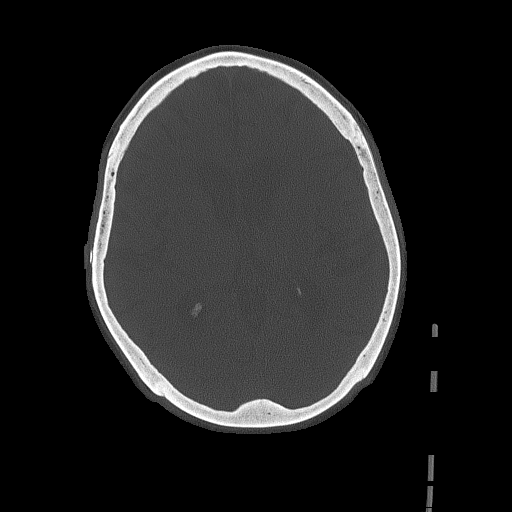
[im 51/82  bone]
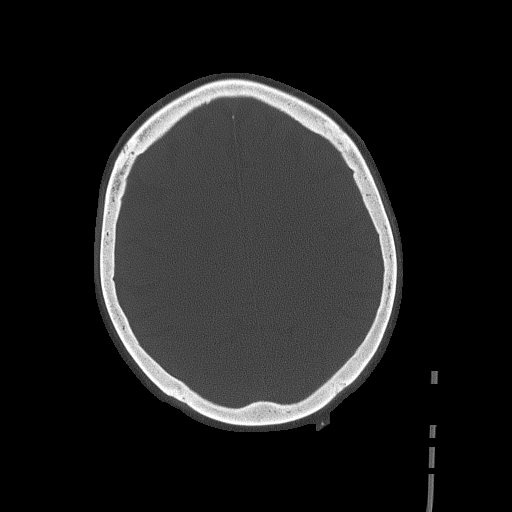
[im 61/82  bone]
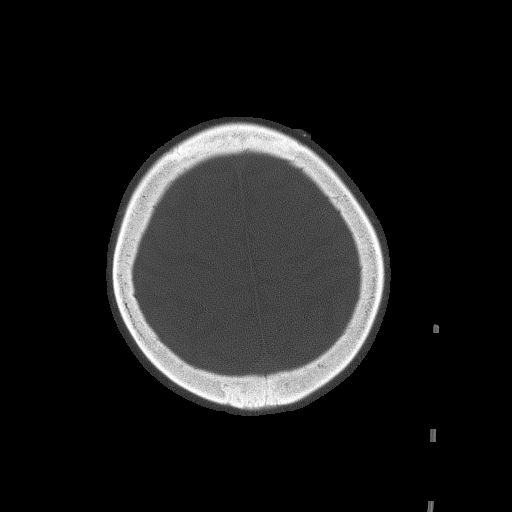
[im 71/82  bone]
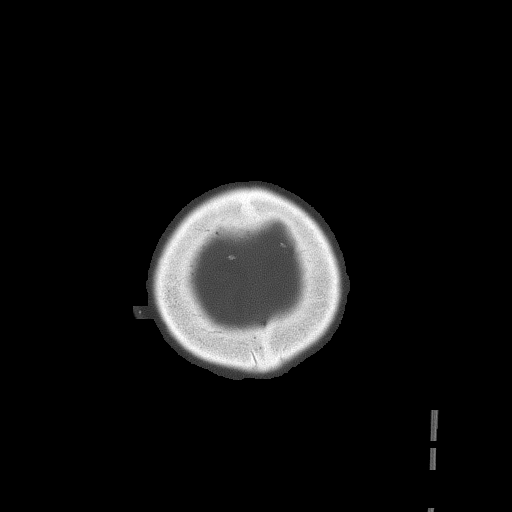

[Series 7: head without cor · coronal · non-contrast · 0.32mm/px · 3 of 70 slices shown]
[im 24/70  brain]
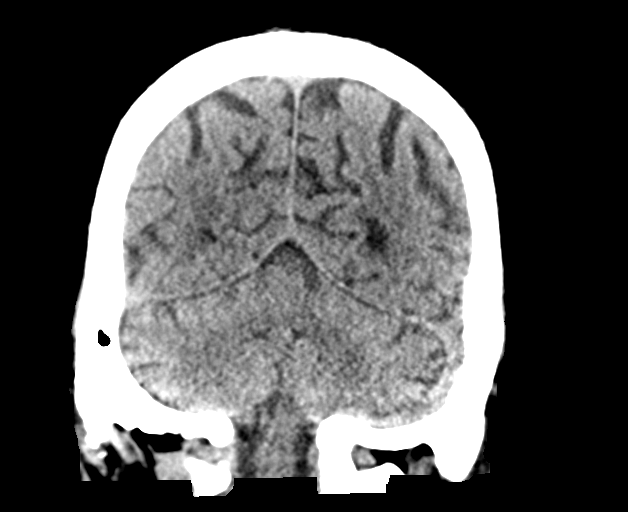
[im 35/70  brain]
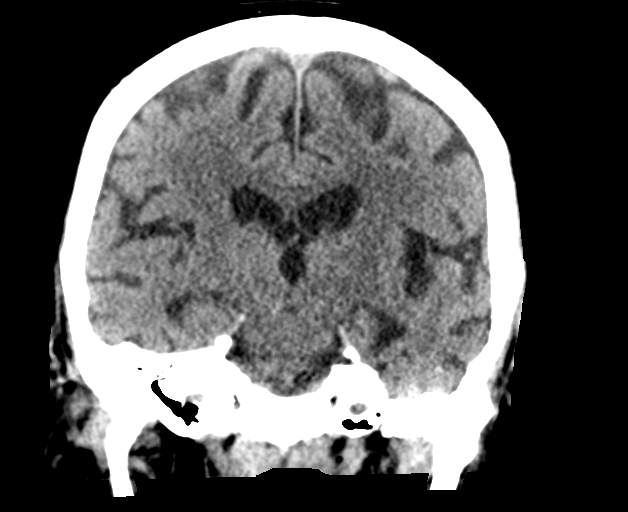
[im 47/70  brain]
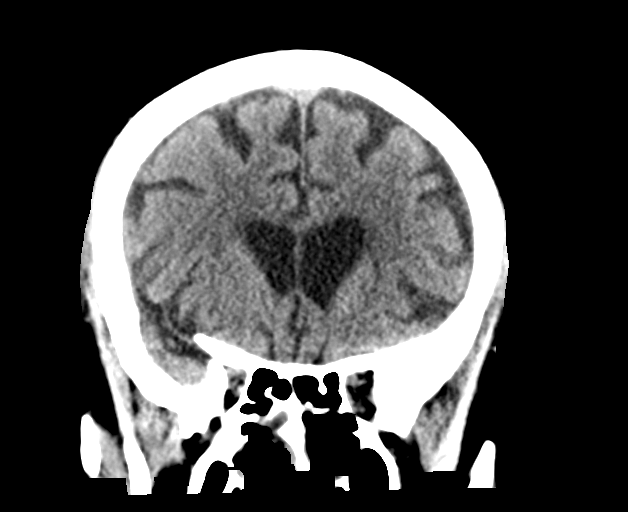

[Series 8: head without sag · sagittal · non-contrast · 0.32mm/px · 2 of 66 slices shown]
[im 22/66  brain]
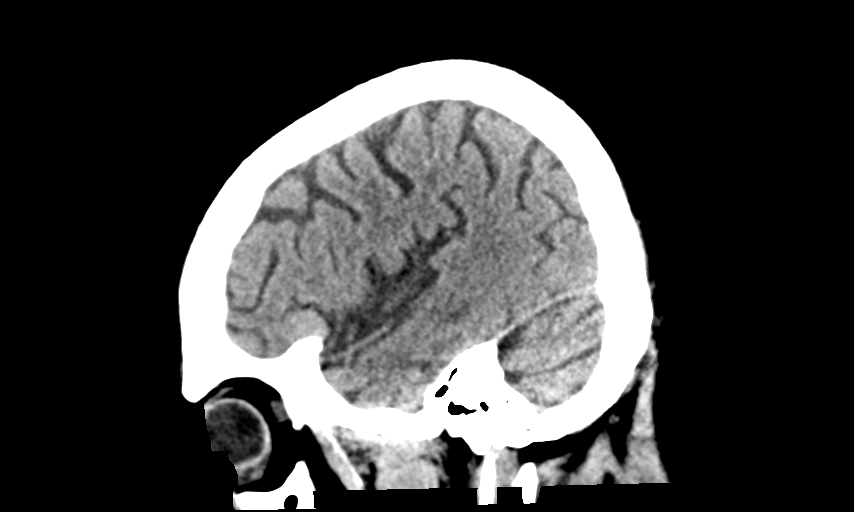
[im 44/66  brain]
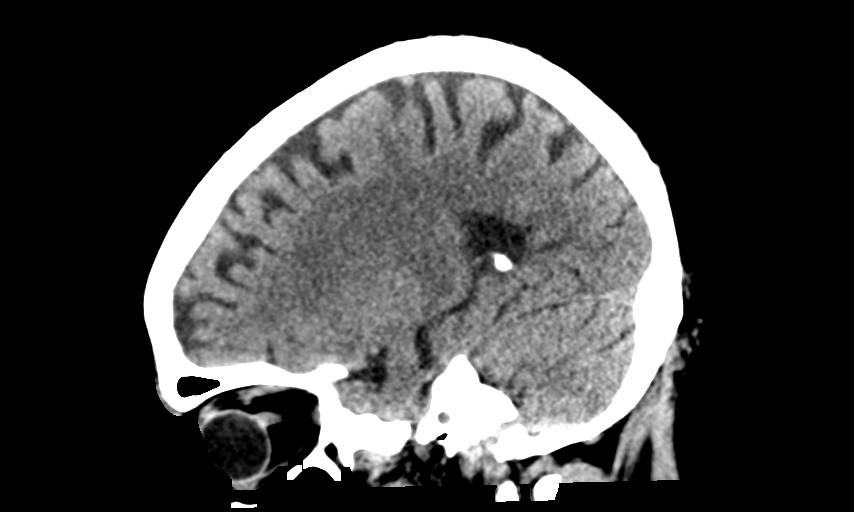

[15 of 47 positions shown; findings below may reference images not displayed]

FINDINGS: CT HEAD FINDINGS

Brain: No evidence of acute infarction, hemorrhage, hydrocephalus,
extra-axial collection or mass lesion/mass effect. Mild deep white
matter microangiopathy.

Vascular: Calcific atherosclerotic disease at the skull base.

Skull: Normal. Negative for fracture or focal lesion.

Sinuses/Orbits: Mucosal thickening of bilateral ethmoid, maxillary
and right sphenoid sinuses.

Other: None.

CT CERVICAL SPINE FINDINGS

Alignment: Straightening of cervical lordosis.

Skull base and vertebrae: No acute fracture. No primary bone lesion
or focal pathologic process. Congenital non fusion of the posterior
aspect of C1 ring.

Soft tissues and spinal canal: No prevertebral fluid or swelling. No
visible canal hematoma.

Disc levels: Multilevel moderate in severity osteoarthritic changes.

Upper chest: Negative.

Other: None.
IMPRESSION: No acute intracranial abnormality. Mild deep white matter
microangiopathy.

No evidence of acute traumatic injury to the cervical spine.

Multilevel osteoarthritic changes.

## 2019-09-28 DIAGNOSIS — Z1383 Encounter for screening for respiratory disorder NEC: Secondary | ICD-10-CM | POA: Diagnosis not present

## 2019-09-28 DIAGNOSIS — Z20828 Contact with and (suspected) exposure to other viral communicable diseases: Secondary | ICD-10-CM | POA: Diagnosis not present

## 2019-10-12 DIAGNOSIS — Z1383 Encounter for screening for respiratory disorder NEC: Secondary | ICD-10-CM | POA: Diagnosis not present

## 2019-10-12 DIAGNOSIS — Z20828 Contact with and (suspected) exposure to other viral communicable diseases: Secondary | ICD-10-CM | POA: Diagnosis not present

## 2019-10-13 DIAGNOSIS — Z23 Encounter for immunization: Secondary | ICD-10-CM | POA: Diagnosis not present

## 2019-10-20 DIAGNOSIS — Z20828 Contact with and (suspected) exposure to other viral communicable diseases: Secondary | ICD-10-CM | POA: Diagnosis not present

## 2019-10-27 DIAGNOSIS — Z20828 Contact with and (suspected) exposure to other viral communicable diseases: Secondary | ICD-10-CM | POA: Diagnosis not present

## 2019-10-31 DIAGNOSIS — I10 Essential (primary) hypertension: Secondary | ICD-10-CM | POA: Diagnosis not present

## 2019-10-31 DIAGNOSIS — L602 Onychogryphosis: Secondary | ICD-10-CM | POA: Diagnosis not present

## 2019-10-31 DIAGNOSIS — F0390 Unspecified dementia without behavioral disturbance: Secondary | ICD-10-CM | POA: Diagnosis not present

## 2019-10-31 DIAGNOSIS — Z972 Presence of dental prosthetic device (complete) (partial): Secondary | ICD-10-CM | POA: Diagnosis not present

## 2019-11-03 DIAGNOSIS — Z1383 Encounter for screening for respiratory disorder NEC: Secondary | ICD-10-CM | POA: Diagnosis not present

## 2019-11-03 DIAGNOSIS — Z20828 Contact with and (suspected) exposure to other viral communicable diseases: Secondary | ICD-10-CM | POA: Diagnosis not present

## 2019-11-09 DIAGNOSIS — Z20828 Contact with and (suspected) exposure to other viral communicable diseases: Secondary | ICD-10-CM | POA: Diagnosis not present

## 2019-11-10 DIAGNOSIS — Z23 Encounter for immunization: Secondary | ICD-10-CM | POA: Diagnosis not present

## 2019-11-17 DIAGNOSIS — I10 Essential (primary) hypertension: Secondary | ICD-10-CM | POA: Diagnosis not present

## 2019-11-19 DIAGNOSIS — Z03818 Encounter for observation for suspected exposure to other biological agents ruled out: Secondary | ICD-10-CM | POA: Diagnosis not present

## 2019-11-19 DIAGNOSIS — Z20828 Contact with and (suspected) exposure to other viral communicable diseases: Secondary | ICD-10-CM | POA: Diagnosis not present

## 2019-11-21 DIAGNOSIS — Z741 Need for assistance with personal care: Secondary | ICD-10-CM | POA: Diagnosis not present

## 2019-11-21 DIAGNOSIS — F0390 Unspecified dementia without behavioral disturbance: Secondary | ICD-10-CM | POA: Diagnosis not present

## 2019-11-21 DIAGNOSIS — M5136 Other intervertebral disc degeneration, lumbar region: Secondary | ICD-10-CM | POA: Diagnosis not present

## 2019-11-21 DIAGNOSIS — R413 Other amnesia: Secondary | ICD-10-CM | POA: Diagnosis not present

## 2019-11-25 DIAGNOSIS — I10 Essential (primary) hypertension: Secondary | ICD-10-CM | POA: Diagnosis not present

## 2019-11-25 DIAGNOSIS — R413 Other amnesia: Secondary | ICD-10-CM | POA: Diagnosis not present

## 2019-11-25 DIAGNOSIS — Z741 Need for assistance with personal care: Secondary | ICD-10-CM | POA: Diagnosis not present

## 2019-11-25 DIAGNOSIS — F0390 Unspecified dementia without behavioral disturbance: Secondary | ICD-10-CM | POA: Diagnosis not present

## 2019-11-25 DIAGNOSIS — M5136 Other intervertebral disc degeneration, lumbar region: Secondary | ICD-10-CM | POA: Diagnosis not present

## 2019-11-25 DIAGNOSIS — Z933 Colostomy status: Secondary | ICD-10-CM | POA: Diagnosis not present

## 2019-11-25 DIAGNOSIS — D649 Anemia, unspecified: Secondary | ICD-10-CM | POA: Diagnosis not present

## 2019-11-27 DIAGNOSIS — M5136 Other intervertebral disc degeneration, lumbar region: Secondary | ICD-10-CM | POA: Diagnosis not present

## 2019-11-27 DIAGNOSIS — F0390 Unspecified dementia without behavioral disturbance: Secondary | ICD-10-CM | POA: Diagnosis not present

## 2019-11-27 DIAGNOSIS — Z741 Need for assistance with personal care: Secondary | ICD-10-CM | POA: Diagnosis not present

## 2019-11-27 DIAGNOSIS — R413 Other amnesia: Secondary | ICD-10-CM | POA: Diagnosis not present

## 2019-11-28 DIAGNOSIS — R413 Other amnesia: Secondary | ICD-10-CM | POA: Diagnosis not present

## 2019-11-28 DIAGNOSIS — F0390 Unspecified dementia without behavioral disturbance: Secondary | ICD-10-CM | POA: Diagnosis not present

## 2019-11-28 DIAGNOSIS — M5136 Other intervertebral disc degeneration, lumbar region: Secondary | ICD-10-CM | POA: Diagnosis not present

## 2019-11-28 DIAGNOSIS — Z741 Need for assistance with personal care: Secondary | ICD-10-CM | POA: Diagnosis not present

## 2019-12-01 DIAGNOSIS — Z741 Need for assistance with personal care: Secondary | ICD-10-CM | POA: Diagnosis not present

## 2019-12-01 DIAGNOSIS — R413 Other amnesia: Secondary | ICD-10-CM | POA: Diagnosis not present

## 2019-12-01 DIAGNOSIS — F0390 Unspecified dementia without behavioral disturbance: Secondary | ICD-10-CM | POA: Diagnosis not present

## 2019-12-01 DIAGNOSIS — M5136 Other intervertebral disc degeneration, lumbar region: Secondary | ICD-10-CM | POA: Diagnosis not present

## 2019-12-02 DIAGNOSIS — F0390 Unspecified dementia without behavioral disturbance: Secondary | ICD-10-CM | POA: Diagnosis not present

## 2019-12-02 DIAGNOSIS — M5136 Other intervertebral disc degeneration, lumbar region: Secondary | ICD-10-CM | POA: Diagnosis not present

## 2019-12-02 DIAGNOSIS — Z741 Need for assistance with personal care: Secondary | ICD-10-CM | POA: Diagnosis not present

## 2019-12-02 DIAGNOSIS — R413 Other amnesia: Secondary | ICD-10-CM | POA: Diagnosis not present

## 2019-12-03 DIAGNOSIS — Z741 Need for assistance with personal care: Secondary | ICD-10-CM | POA: Diagnosis not present

## 2019-12-03 DIAGNOSIS — F0390 Unspecified dementia without behavioral disturbance: Secondary | ICD-10-CM | POA: Diagnosis not present

## 2019-12-03 DIAGNOSIS — R413 Other amnesia: Secondary | ICD-10-CM | POA: Diagnosis not present

## 2019-12-03 DIAGNOSIS — M5136 Other intervertebral disc degeneration, lumbar region: Secondary | ICD-10-CM | POA: Diagnosis not present

## 2019-12-08 DIAGNOSIS — R413 Other amnesia: Secondary | ICD-10-CM | POA: Diagnosis not present

## 2019-12-08 DIAGNOSIS — F0390 Unspecified dementia without behavioral disturbance: Secondary | ICD-10-CM | POA: Diagnosis not present

## 2019-12-08 DIAGNOSIS — Z741 Need for assistance with personal care: Secondary | ICD-10-CM | POA: Diagnosis not present

## 2019-12-08 DIAGNOSIS — M5136 Other intervertebral disc degeneration, lumbar region: Secondary | ICD-10-CM | POA: Diagnosis not present

## 2019-12-10 DIAGNOSIS — F0390 Unspecified dementia without behavioral disturbance: Secondary | ICD-10-CM | POA: Diagnosis not present

## 2019-12-10 DIAGNOSIS — M5136 Other intervertebral disc degeneration, lumbar region: Secondary | ICD-10-CM | POA: Diagnosis not present

## 2019-12-10 DIAGNOSIS — R413 Other amnesia: Secondary | ICD-10-CM | POA: Diagnosis not present

## 2019-12-10 DIAGNOSIS — Z741 Need for assistance with personal care: Secondary | ICD-10-CM | POA: Diagnosis not present

## 2019-12-11 DIAGNOSIS — F0390 Unspecified dementia without behavioral disturbance: Secondary | ICD-10-CM | POA: Diagnosis not present

## 2019-12-12 DIAGNOSIS — F0390 Unspecified dementia without behavioral disturbance: Secondary | ICD-10-CM | POA: Diagnosis not present

## 2019-12-12 DIAGNOSIS — Z741 Need for assistance with personal care: Secondary | ICD-10-CM | POA: Diagnosis not present

## 2019-12-12 DIAGNOSIS — M5136 Other intervertebral disc degeneration, lumbar region: Secondary | ICD-10-CM | POA: Diagnosis not present

## 2019-12-12 DIAGNOSIS — R413 Other amnesia: Secondary | ICD-10-CM | POA: Diagnosis not present

## 2019-12-15 DIAGNOSIS — F0390 Unspecified dementia without behavioral disturbance: Secondary | ICD-10-CM | POA: Diagnosis not present

## 2019-12-15 DIAGNOSIS — M5136 Other intervertebral disc degeneration, lumbar region: Secondary | ICD-10-CM | POA: Diagnosis not present

## 2019-12-15 DIAGNOSIS — R413 Other amnesia: Secondary | ICD-10-CM | POA: Diagnosis not present

## 2019-12-15 DIAGNOSIS — Z741 Need for assistance with personal care: Secondary | ICD-10-CM | POA: Diagnosis not present

## 2019-12-17 DIAGNOSIS — Z741 Need for assistance with personal care: Secondary | ICD-10-CM | POA: Diagnosis not present

## 2019-12-17 DIAGNOSIS — R413 Other amnesia: Secondary | ICD-10-CM | POA: Diagnosis not present

## 2019-12-17 DIAGNOSIS — F0390 Unspecified dementia without behavioral disturbance: Secondary | ICD-10-CM | POA: Diagnosis not present

## 2019-12-17 DIAGNOSIS — M5136 Other intervertebral disc degeneration, lumbar region: Secondary | ICD-10-CM | POA: Diagnosis not present

## 2019-12-18 DIAGNOSIS — F0390 Unspecified dementia without behavioral disturbance: Secondary | ICD-10-CM | POA: Diagnosis not present

## 2019-12-18 DIAGNOSIS — E559 Vitamin D deficiency, unspecified: Secondary | ICD-10-CM | POA: Diagnosis not present

## 2019-12-18 DIAGNOSIS — Z741 Need for assistance with personal care: Secondary | ICD-10-CM | POA: Diagnosis not present

## 2019-12-18 DIAGNOSIS — I10 Essential (primary) hypertension: Secondary | ICD-10-CM | POA: Diagnosis not present

## 2019-12-18 DIAGNOSIS — M5136 Other intervertebral disc degeneration, lumbar region: Secondary | ICD-10-CM | POA: Diagnosis not present

## 2019-12-18 DIAGNOSIS — R413 Other amnesia: Secondary | ICD-10-CM | POA: Diagnosis not present

## 2019-12-18 DIAGNOSIS — D519 Vitamin B12 deficiency anemia, unspecified: Secondary | ICD-10-CM | POA: Diagnosis not present

## 2020-01-01 DIAGNOSIS — F66 Other sexual disorders: Secondary | ICD-10-CM | POA: Diagnosis not present

## 2020-01-01 DIAGNOSIS — F0391 Unspecified dementia with behavioral disturbance: Secondary | ICD-10-CM | POA: Diagnosis not present

## 2020-01-02 DIAGNOSIS — Z933 Colostomy status: Secondary | ICD-10-CM | POA: Diagnosis not present

## 2020-01-02 DIAGNOSIS — D649 Anemia, unspecified: Secondary | ICD-10-CM | POA: Diagnosis not present

## 2020-01-02 DIAGNOSIS — I10 Essential (primary) hypertension: Secondary | ICD-10-CM | POA: Diagnosis not present

## 2020-01-02 DIAGNOSIS — F0390 Unspecified dementia without behavioral disturbance: Secondary | ICD-10-CM | POA: Diagnosis not present

## 2020-01-15 DIAGNOSIS — D649 Anemia, unspecified: Secondary | ICD-10-CM | POA: Diagnosis not present

## 2020-01-15 DIAGNOSIS — M5136 Other intervertebral disc degeneration, lumbar region: Secondary | ICD-10-CM | POA: Diagnosis not present

## 2020-01-15 DIAGNOSIS — I10 Essential (primary) hypertension: Secondary | ICD-10-CM | POA: Diagnosis not present

## 2020-01-15 DIAGNOSIS — F0390 Unspecified dementia without behavioral disturbance: Secondary | ICD-10-CM | POA: Diagnosis not present

## 2020-03-25 DIAGNOSIS — D649 Anemia, unspecified: Secondary | ICD-10-CM | POA: Diagnosis not present

## 2020-03-25 DIAGNOSIS — I10 Essential (primary) hypertension: Secondary | ICD-10-CM | POA: Diagnosis not present

## 2020-03-25 DIAGNOSIS — D519 Vitamin B12 deficiency anemia, unspecified: Secondary | ICD-10-CM | POA: Diagnosis not present

## 2020-03-25 DIAGNOSIS — F0390 Unspecified dementia without behavioral disturbance: Secondary | ICD-10-CM | POA: Diagnosis not present

## 2020-04-01 DIAGNOSIS — F66 Other sexual disorders: Secondary | ICD-10-CM | POA: Diagnosis not present

## 2020-04-01 DIAGNOSIS — F0390 Unspecified dementia without behavioral disturbance: Secondary | ICD-10-CM | POA: Diagnosis not present

## 2020-04-12 DIAGNOSIS — Z8619 Personal history of other infectious and parasitic diseases: Secondary | ICD-10-CM | POA: Diagnosis not present

## 2020-04-12 DIAGNOSIS — R278 Other lack of coordination: Secondary | ICD-10-CM | POA: Diagnosis not present

## 2020-04-12 DIAGNOSIS — F0390 Unspecified dementia without behavioral disturbance: Secondary | ICD-10-CM | POA: Diagnosis not present

## 2020-04-29 DIAGNOSIS — Z933 Colostomy status: Secondary | ICD-10-CM | POA: Diagnosis not present

## 2020-04-29 DIAGNOSIS — M5136 Other intervertebral disc degeneration, lumbar region: Secondary | ICD-10-CM | POA: Diagnosis not present

## 2020-04-29 DIAGNOSIS — D649 Anemia, unspecified: Secondary | ICD-10-CM | POA: Diagnosis not present

## 2020-04-29 DIAGNOSIS — I10 Essential (primary) hypertension: Secondary | ICD-10-CM | POA: Diagnosis not present

## 2020-05-04 DIAGNOSIS — Z20828 Contact with and (suspected) exposure to other viral communicable diseases: Secondary | ICD-10-CM | POA: Diagnosis not present

## 2020-05-04 DIAGNOSIS — Z1383 Encounter for screening for respiratory disorder NEC: Secondary | ICD-10-CM | POA: Diagnosis not present

## 2020-06-14 DIAGNOSIS — Z20822 Contact with and (suspected) exposure to covid-19: Secondary | ICD-10-CM | POA: Diagnosis not present

## 2020-06-18 DIAGNOSIS — D519 Vitamin B12 deficiency anemia, unspecified: Secondary | ICD-10-CM | POA: Diagnosis not present

## 2020-06-18 DIAGNOSIS — Z933 Colostomy status: Secondary | ICD-10-CM | POA: Diagnosis not present

## 2020-06-18 DIAGNOSIS — F0390 Unspecified dementia without behavioral disturbance: Secondary | ICD-10-CM | POA: Diagnosis not present

## 2020-06-18 DIAGNOSIS — I10 Essential (primary) hypertension: Secondary | ICD-10-CM | POA: Diagnosis not present

## 2020-09-01 ENCOUNTER — Emergency Department (HOSPITAL_COMMUNITY): Payer: Medicare Other

## 2020-09-01 ENCOUNTER — Observation Stay (HOSPITAL_COMMUNITY)
Admission: EM | Admit: 2020-09-01 | Discharge: 2020-09-02 | Disposition: A | Discharge status: Home / Self Care | Payer: Medicare Other | Attending: Internal Medicine | Admitting: Internal Medicine

## 2020-09-01 ENCOUNTER — Other Ambulatory Visit: Payer: Self-pay

## 2020-09-01 ENCOUNTER — Encounter (HOSPITAL_COMMUNITY): Payer: Self-pay | Admitting: Emergency Medicine

## 2020-09-01 DIAGNOSIS — R2689 Other abnormalities of gait and mobility: Secondary | ICD-10-CM | POA: Insufficient documentation

## 2020-09-01 DIAGNOSIS — Z20822 Contact with and (suspected) exposure to covid-19: Secondary | ICD-10-CM | POA: Diagnosis not present

## 2020-09-01 DIAGNOSIS — F0390 Unspecified dementia without behavioral disturbance: Secondary | ICD-10-CM

## 2020-09-01 DIAGNOSIS — I10 Essential (primary) hypertension: Secondary | ICD-10-CM

## 2020-09-01 DIAGNOSIS — Z79899 Other long term (current) drug therapy: Secondary | ICD-10-CM | POA: Diagnosis not present

## 2020-09-01 DIAGNOSIS — W19XXXA Unspecified fall, initial encounter: Secondary | ICD-10-CM

## 2020-09-01 DIAGNOSIS — R404 Transient alteration of awareness: Secondary | ICD-10-CM | POA: Diagnosis not present

## 2020-09-01 DIAGNOSIS — R001 Bradycardia, unspecified: Secondary | ICD-10-CM

## 2020-09-01 DIAGNOSIS — R55 Syncope and collapse: Secondary | ICD-10-CM | POA: Diagnosis present

## 2020-09-01 DIAGNOSIS — W010XXA Fall on same level from slipping, tripping and stumbling without subsequent striking against object, initial encounter: Secondary | ICD-10-CM

## 2020-09-01 LAB — BASIC METABOLIC PANEL
Anion gap: 11 (ref 5–15)
BUN: 15 mg/dL (ref 8–23)
CO2: 24 mmol/L (ref 22–32)
Calcium: 8.9 mg/dL (ref 8.9–10.3)
Chloride: 96 mmol/L — ABNORMAL LOW (ref 98–111)
Creatinine, Ser: 1.11 mg/dL (ref 0.61–1.24)
GFR, Estimated: >60 mL/min (ref >60)
Glucose, Bld: 107 mg/dL — ABNORMAL HIGH (ref 70–99)
Potassium: 4 mmol/L (ref 3.5–5.1)
Sodium: 131 mmol/L — ABNORMAL LOW (ref 135–145)

## 2020-09-01 LAB — TROPONIN I (HIGH SENSITIVITY): Troponin I (High Sensitivity): 5 ng/L (ref <18)

## 2020-09-01 LAB — CBC WITH DIFFERENTIAL/PLATELET
Abs Immature Granulocytes: 0.04 10*3/uL (ref 0.00–0.07)
Basophils Absolute: 0 10*3/uL (ref 0.0–0.1)
Basophils Relative: 0 %
Eosinophils Absolute: 0.1 10*3/uL (ref 0.0–0.5)
Eosinophils Relative: 1 %
HCT: 34.9 % — ABNORMAL LOW (ref 39.0–52.0)
Hemoglobin: 11.1 g/dL — ABNORMAL LOW (ref 13.0–17.0)
Immature Granulocytes: 0 %
Lymphocytes Relative: 29 %
Lymphs Abs: 2.8 10*3/uL (ref 0.7–4.0)
MCH: 28.9 pg (ref 26.0–34.0)
MCHC: 31.8 g/dL (ref 30.0–36.0)
MCV: 90.9 fL (ref 80.0–100.0)
Monocytes Absolute: 0.9 10*3/uL (ref 0.1–1.0)
Monocytes Relative: 9 %
Neutro Abs: 5.9 10*3/uL (ref 1.7–7.7)
Neutrophils Relative %: 61 %
Platelets: 221 10*3/uL (ref 150–400)
RBC: 3.84 MIL/uL — ABNORMAL LOW (ref 4.22–5.81)
RDW: 12.4 % (ref 11.5–15.5)
WBC: 9.7 10*3/uL (ref 4.0–10.5)
nRBC: 0 % (ref 0.0–0.2)

## 2020-09-01 LAB — RESP PANEL BY RT-PCR (FLU A&B, COVID) ARPGX2
Influenza A by PCR: NEGATIVE
Influenza B by PCR: NEGATIVE
SARS Coronavirus 2 by RT PCR: NEGATIVE

## 2020-09-01 LAB — CBG MONITORING, ED: Glucose-Capillary: 94 mg/dL (ref 70–99)

## 2020-09-01 LAB — MAGNESIUM: Magnesium: 2 mg/dL (ref 1.7–2.4)

## 2020-09-01 MED ORDER — VITAMIN B-12 1000 MCG PO TABS
1000.0000 ug | ORAL_TABLET | Freq: Every day | ORAL | Status: DC
  Administered 2020-09-02: 10:00:00 1000 ug via ORAL
  Filled 2020-09-01: qty 1

## 2020-09-01 MED ORDER — ENOXAPARIN SODIUM 40 MG/0.4ML ~~LOC~~ SOLN: 40.0000 mg | SUBCUTANEOUS | Status: DC

## 2020-09-01 MED ORDER — ACETAMINOPHEN 325 MG PO TABS: 650.0000 mg | ORAL_TABLET | Freq: Four times a day (QID) | ORAL | Status: DC | PRN

## 2020-09-01 MED ORDER — SODIUM CHLORIDE 0.9 % IV BOLUS
1000.0000 mL | Freq: Once | INTRAVENOUS | Status: AC
  Administered 2020-09-01: 1000 mL via INTRAVENOUS

## 2020-09-01 MED ORDER — FLUTICASONE PROPIONATE 50 MCG/ACT NA SUSP
1.0000 | Freq: Every day | NASAL | Status: DC
  Administered 2020-09-02: 10:00:00 1 via NASAL
  Filled 2020-09-01: qty 16

## 2020-09-01 MED ORDER — ACETAMINOPHEN 650 MG RE SUPP: 650.0000 mg | Freq: Four times a day (QID) | RECTAL | Status: DC | PRN

## 2020-09-01 MED ORDER — SODIUM CHLORIDE 0.9 % IV SOLN: INTRAVENOUS | Status: AC

## 2020-09-01 MED ORDER — DONEPEZIL HCL 10 MG PO TABS
10.0000 mg | ORAL_TABLET | Freq: Every day | ORAL | Status: DC
  Administered 2020-09-02: 02:00:00 10 mg via ORAL
  Filled 2020-09-01 (×2): qty 1

## 2020-09-01 NOTE — ED Notes (Signed)
Assessed stoma area while changing out colostomy bag. Stoma red, skin raw around stoma area. Previous dressing was applied about 1 cm too far away from stoma site.

## 2020-09-01 NOTE — ED Provider Notes (Signed)
[facility] EMERGENCY DEPARTMENT Provider Note   CSN: [id number] Arrival date & time: 09/01/20  1206     History Chief Complaint  Patient presents with  . Fall    Wyatt King is a 84 y.o. male w/ advanced dementia presenting from memory care facility with episode of syncope.  Patient evaluated by Dr Denton Lank initially, found to have stable vital signs and normal glucose, and was ambulating steadily and eating.  He was cleared for discharge.  While waiting for PTAR, the patient was ambulating around the bed and became lightheaded and had a possible short episode of syncope or near syncope.  He was laid into the bed immediately.  No significant fall or head trauma witnessed.  He is not on A/C.  The patient is unable to provide further history.  HPI     Past Medical History:  Diagnosis Date  . Cataract   . Essential hypertension   . Memory disorder 07/07/2016  . Vitamin B12 deficiency 07/07/2016    Patient Active Problem List   Diagnosis Date Noted  . Syncope 09/01/2020  . Memory disorder 07/07/2016  . Vitamin B12 deficiency 07/07/2016    Past Surgical History:  Procedure Laterality Date  . COLOSTOMY         Family History  Problem Relation Age of Onset  . Stroke Father     Social History   Tobacco Use  . Smoking status: Never Smoker  . Smokeless tobacco: Never Used  Vaping Use  . Vaping Use: Never used  Substance Use Topics  . Alcohol use: No  . Drug use: No    Home Medications Prior to Admission medications   Medication Sig Start Date End Date Taking? Authorizing Provider  amLODipine (NORVASC) 5 MG tablet Take 5 mg by mouth daily.   Yes [provider]  donepezil (ARICEPT) 10 MG tablet Take 10 mg by mouth at bedtime. 12/02/17  Yes [provider]  lisinopril (ZESTRIL) 2.5 MG tablet Take 2.5 mg by mouth daily.   Yes [provider]  vitamin B-12 (CYANOCOBALAMIN) 1000 MCG tablet Take 1,000 mcg by mouth daily.    Yes [provider]  cephALEXin (KEFLEX) 500 MG capsule Take 1 capsule (500 mg total) by mouth 4 (four) times daily. Patient not taking: No sig reported 04/20/18   Bethann Berkshire, MD    Allergies    Patient has no known allergies.  Review of Systems   Review of Systems  Unable to perform ROS: Dementia (level 5 caveat)    Physical Exam Updated Vital Signs BP 130/74 (BP Location: Right Arm)   Pulse (!) 57   Temp 98.5 F (36.9 C) (Oral)   Resp 19   Ht 5\' 2"  (1.575 m)   Wt 95 kg   SpO2 100%   BMI 38.31 kg/m   Physical Exam  ED Results / Procedures / Treatments   Labs (all labs ordered are listed, but only abnormal results are displayed) Labs Reviewed  BASIC METABOLIC PANEL - Abnormal; Notable for the following components:      Result Value   Sodium 131 (*)    Chloride 96 (*)    Glucose, Bld 107 (*)    All other components within normal limits  CBC WITH DIFFERENTIAL/PLATELET - Abnormal; Notable for the following components:   RBC 3.84 (*)    Hemoglobin 11.1 (*)    HCT 34.9 (*)    All other components within normal limits  RESP PANEL BY RT-PCR (FLU A&B,  COVID) ARPGX2  MAGNESIUM  URINALYSIS, ROUTINE W REFLEX MICROSCOPIC  CBG MONITORING, ED  TROPONIN I (HIGH SENSITIVITY)    EKG EKG Interpretation  Date/Time:  Wednesday September 01 2020 15:17:03 EST Ventricular Rate:  58 PR Interval:  260 QRS Duration: 90 QT Interval:  454 QTC Calculation: 445 R Axis:   64 Text Interpretation: Sinus bradycardia with 1st degree A-V block Nonspecific ST and T wave abnormality Abnormal ECG No sig change from Jan 12 202 ecg, Minor inferior ST elevations noted on prior tracings, does not meet STEMI criteria Confirmed by Alvester Chou [phone]) on 09/01/2020 3:30:41 PM   Radiology CT Head Wo Contrast  Result Date: 09/01/2020 CLINICAL DATA:  Altered mental status. EXAM: CT HEAD WITHOUT CONTRAST TECHNIQUE: Contiguous axial images were obtained from the base of the skull  through the vertex without intravenous contrast. COMPARISON:  September 15, 2018. FINDINGS: Brain: Mild chronic ischemic white matter disease is noted. No mass effect or midline shift is noted. Ventricular size is within normal limits. There is no evidence of mass lesion, hemorrhage or acute infarction. Vascular: No hyperdense vessel or unexpected calcification. Skull: Normal. Negative for fracture or focal lesion. Sinuses/Orbits: Bilateral ethmoid and maxillary sinusitis is noted. Other: None. IMPRESSION: Mild chronic ischemic white matter disease. Bilateral ethmoid and maxillary sinusitis. No acute intracranial abnormality seen. Electronically Signed   By: Lupita Raider M.D.   On: 09/01/2020 17:30   DG Chest Portable 1 View  Result Date: 09/01/2020 CLINICAL DATA:  Syncope. EXAM: PORTABLE CHEST 1 VIEW COMPARISON:  None. FINDINGS: The heart size and mediastinal contours are within normal limits. Both lungs are clear. No pneumothorax or pleural effusion is noted. The visualized skeletal structures are unremarkable. IMPRESSION: No active disease. Electronically Signed   By: Lupita Raider M.D.   On: 09/01/2020 15:44    Procedures Procedures (including critical care time)  Medications Ordered in ED Medications  sodium chloride 0.9 % bolus 1,000 mL (0 mLs Intravenous Stopped 09/01/20 1653)    ED Course  I have reviewed the triage vital signs and the nursing notes.  Pertinent labs & imaging results that were available during my care of the patient were reviewed by me and considered in my medical decision making (see chart for details).  This patient presents with syncope vs near syncope. This involves an extensive number of treatment options, and is a complaint that carries with it a high risk of complications and morbidity.  The differential diagnosis includes cardiac arrhythmia vs vasovagal syncope vs anemia vs dehydration vs other  Immediately after witnessed near syncopal vs syncope episode  (no longer than 1 minute, as patient was awake when I reached bedside), BP was 80/60, HR 50's.  BP improved quickly on it's own.  This may have been a vagal episode, but the primary concern is ruling out cardiogenic cause.  No prior echo on record.  No evidence of sepsis or infection at this time.  I ordered, reviewed, and interpreted labs, which included trop 5, Mg 2, K 4.0, Cr 1.11, WBC 9.7, Hgb 11.1.  Flu/covid negative.  UA pending. I ordered medication IV fluids for dehydration I ordered imaging studies which included CTH and DG chest.   I independently visualized and interpreted imaging which showed no acute findings and the monitor tracing which showed HR 50's, no ischemic findings   Clinical Course as of 09/01/20 1953  Wed Sep 01, 2020  1818 Negative orthostatic VS here.  No prior echocardiogram on file.  Plan for  admission for echo/cardiac evaluation. [MT]  1822 Daughter Leeland Lovelady updated by phone. [MT]  1940 Admitted to hospitalist. [MT]    Clinical Course User Index [MT] Renaye Rakers Kermit Balo, MD    Final Clinical Impression(s) / ED Diagnoses Final diagnoses:  Fall from slip, trip, or stumble, initial encounter  Dementia without behavioral disturbance, unspecified dementia type [facility])    Rx / DC Orders ED Discharge Orders    None       Siegfried Vieth, Kermit Balo, MD 09/01/20 1954

## 2020-09-01 NOTE — ED Notes (Signed)
Ostomy Pouch (Drainable 1 pc 2 1/4 flat) Ordered x2(Reference [phone], Order #725)-per Sula Rumple RN ordered by Marylene Land

## 2020-09-01 NOTE — ED Notes (Signed)
Ambulating to bathroom with assistance.

## 2020-09-01 NOTE — Discharge Instructions (Addendum)
It was our pleasure to provide your ER care today - we hope that you feel better.  Fall precautions.   Follow up with your doctor.   Return to ER if worse, new symptoms, change in mental status, new or severe pain, severe headache, persistent vomiting, weak/fainting, or other concern.

## 2020-09-01 NOTE — ED Provider Notes (Addendum)
[facility] EMERGENCY DEPARTMENT Provider Note   CSN: [id number] Arrival date & time: 09/01/20  1206     History Chief Complaint  Patient presents with  . Fall    Wyatt King is a 84 y.o. male.  Patient with hx dementia, presents via EMS from [facility] s/p fall. Symptoms acute onset, episodic, witness fall, no loc, and reportedly acting normally/mental status at baseline since fall. No LOC noted. No nausea/vomiting. Pt has remained alert, content, since fall without c/o of pain. No headache. No chest pain. No abd pain or nv. Denies extremity pain or injury. Skin intact. Pt is limited historian - level 5 caveat. No anticoagulant med use.   The history is provided by the patient and the EMS personnel. The history is limited by the condition of the patient.       Past Medical History:  Diagnosis Date  . Cataract   . Essential hypertension   . Memory disorder 07/07/2016  . Vitamin B12 deficiency 07/07/2016    Patient Active Problem List   Diagnosis Date Noted  . Memory disorder 07/07/2016  . Vitamin B12 deficiency 07/07/2016    Past Surgical History:  Procedure Laterality Date  . COLOSTOMY         Family History  Problem Relation Age of Onset  . Stroke Father     Social History   Tobacco Use  . Smoking status: Never Smoker  . Smokeless tobacco: Never Used  Vaping Use  . Vaping Use: Never used  Substance Use Topics  . Alcohol use: No  . Drug use: No    Home Medications Prior to Admission medications   Medication Sig Start Date End Date Taking? Authorizing Provider  amLODipine (NORVASC) 10 MG tablet Take 10 mg by mouth daily. 12/02/17   [provider]  cephALEXin (KEFLEX) 500 MG capsule Take 1 capsule (500 mg total) by mouth 4 (four) times daily. Patient not taking: Reported on 09/15/2018 04/20/18   Bethann Berkshire, MD  donepezil (ARICEPT) 10 MG tablet Take 10 mg by mouth at bedtime. 12/02/17   [provider]  latanoprost  (XALATAN) 0.005 % ophthalmic solution Place 1 drop into both eyes at bedtime. 12/02/17   [provider]  Multiple Vitamin (MULTIVITAMIN) tablet Take 1 tablet by mouth daily.    [provider]    Allergies    Patient has no known allergies.  Review of Systems   Review of Systems  Constitutional: Negative for fever.  HENT: Negative for nosebleeds.   Eyes: Negative for pain and visual disturbance.  Respiratory: Negative for shortness of breath.   Cardiovascular: Negative for chest pain.  Gastrointestinal: Negative for abdominal pain and vomiting.  Genitourinary: Negative for flank pain.  Musculoskeletal: Negative for back pain and neck pain.  Skin: Negative for wound.  Neurological: Negative for weakness, numbness and headaches.  Hematological: Does not bruise/bleed easily.  Psychiatric/Behavioral: Negative for agitation.    Physical Exam Updated Vital Signs BP 96/63 (BP Location: Right Arm)   Pulse (!) 53   Temp 98.5 F (36.9 C) (Oral)   Resp 15   Ht 1.575 m (5\' 2" )   Wt 95 kg   SpO2 96%   BMI 38.31 kg/m   Physical Exam Vitals and nursing note reviewed.  Constitutional:      Appearance: Normal appearance. He is well-developed.  HENT:     Head: Atraumatic.     Comments: No facial or scap sts or tenderness noted.     Nose:  Nose normal.     Mouth/Throat:     Mouth: Mucous membranes are moist.     Pharynx: Oropharynx is clear.  Eyes:     General: No scleral icterus.    Conjunctiva/sclera: Conjunctivae normal.     Pupils: Pupils are equal, round, and reactive to light.  Neck:     Trachea: No tracheal deviation.  Cardiovascular:     Rate and Rhythm: Normal rate and regular rhythm.     Pulses: Normal pulses.     Heart sounds: Normal heart sounds. No murmur heard. No friction rub. No gallop.   Pulmonary:     Effort: Pulmonary effort is normal. No accessory muscle usage or respiratory distress.     Breath sounds: Normal breath sounds.  Chest:      Chest wall: No tenderness.  Abdominal:     General: Bowel sounds are normal. There is no distension.     Palpations: Abdomen is soft.     Tenderness: There is no abdominal tenderness.  Genitourinary:    Comments: No cva tenderness. Musculoskeletal:        General: No swelling.     Cervical back: Normal range of motion and neck supple. No rigidity or tenderness.     Comments: CTLS spine, non tender, aligned, no step off. Good rom bilateral extremities without pain or focal bony tenderness.   Skin:    General: Skin is warm and dry.     Findings: No rash.  Neurological:     Mental Status: He is alert.     Comments: Alert, speech clear. Motor/sens grossly intact bil. Moves bil ext purposefully, without pain. GCS 15.  Psychiatric:        Mood and Affect: Mood normal.     ED Results / Procedures / Treatments   Labs (all labs ordered are listed, but only abnormal results are displayed) Labs Reviewed - No data to display  EKG None  Radiology No results found.  Procedures Procedures (including critical care time)  Medications Ordered in ED Medications - No data to display  ED Course  I have reviewed the triage vital signs and the nursing notes.  Pertinent labs & imaging results that were available during my care of the patient were reviewed by me and considered in my medical decision making (see chart for details).    MDM Rules/Calculators/A&P                         Reviewed nursing notes and prior charts for additional history.   On recheck, pt continues to deny pain or injury. Remains alert, content appearing, conversant, and without any physical complaint.   Pt outfitted with correct size bp cuff - bp's normal. No faintness or dizziness. Pt continues to indicate feels fine. No c/o. Po fluids,food, ambulate in hall. No new c/o.   Pt currently appears stable for d/c.   Fall precautions, and return precautions provided.       Final Clinical Impression(s) / ED  Diagnoses Final diagnoses:  None    Rx / DC Orders ED Discharge Orders    None         Cathren Laine, MD 09/01/20 1350

## 2020-09-01 NOTE — ED Notes (Signed)
PTAR called/canceled @ 1529-per Clydie Braun, RN called by Marylene Land

## 2020-09-01 NOTE — ED Notes (Signed)
[employer] called to assess monitor.

## 2020-09-01 NOTE — ED Notes (Signed)
Monitor fixed. Resuming vitals on monitor.

## 2020-09-01 NOTE — H&P (Signed)
History and Physical    Wyatt CockingJessie King WUJ:[id number] DOB: 07-02-1936 DOA: 09/01/2020  PCP: Darrow BussingKoirala, Dibas, MD [facility]  Chief Complaint: Fall  HPI: Wyatt CockingJessie King is a 84 y.o. male with medical history significant of advanced dementia, hypertension being brought to the ED via EMS after he had a witnessed fall at his nursing home.  No loss of consciousness reported.  He is not on any anticoagulant or antiplatelet agents.  History provided by patient limited due to his advanced dementia.  He is oriented to self only and does not remember falling.  Denies dizziness, chest pain, shortness of breath, or abdominal pain.  ED Course: Afebrile. Slightly bradycardic with heart rate in the 50s.  Blood pressure low with systolic in the 90s.  Not hypoxic.  WBC 9.7, hemoglobin 11.1 (not significantly changed from baseline), platelet 221K.  Sodium 131, potassium 4.0, chloride 96, bicarb 24, BUN 15, creatinine 1.1, glucose 107.  High-sensitivity troponin negative.  Magnesium level normal.  UA pending.  SARS-CoV-2 PCR test and influenza panel both negative.  Chest x-ray showing no active disease.  Head CT negative for acute intracranial abnormality.  Patient was given 1 L fluid bolus.  After initial evaluation in the ED, patient was felt to be stable for discharge.  However, while waiting for PTAR, patient was ambulating and became lightheaded and had a short episode of syncope or near syncope.  He was laid into the bed immediately.  No significant fall or head trauma witnessed.  When ED provider arrived to see the patient he was awake, however, hypotensive with blood pressure 80/60 and slightly bradycardic with heart rate in the 50s.  Blood pressure improved quickly on its own prior to patient being given any IV fluid boluses.  Orthostatics were checked -blood pressure did drop from 152/77 supine to 132/70 standing, however, heart rate decreased from 74 supine to 59  standing.  Admission requested for syncope work-up.  Review of Systems:  All systems reviewed and apart from history of presenting illness, are negative.  Past Medical History:  Diagnosis Date  . Cataract   . Essential hypertension   . Memory disorder 07/07/2016  . Vitamin B12 deficiency 07/07/2016    Past Surgical History:  Procedure Laterality Date  . COLOSTOMY       reports that he has never smoked. He has never used smokeless tobacco. He reports that he does not drink alcohol and does not use drugs.  No Known Allergies  Family History  Problem Relation Age of Onset  . Stroke Father     Prior to Admission medications   Medication Sig Start Date End Date Taking? Authorizing Provider  amLODipine (NORVASC) 5 MG tablet Take 5 mg by mouth daily.   Yes [provider]  donepezil (ARICEPT) 10 MG tablet Take 10 mg by mouth at bedtime. 12/02/17  Yes [provider]  lisinopril (ZESTRIL) 2.5 MG tablet Take 2.5 mg by mouth daily.   Yes [provider]  vitamin B-12 (CYANOCOBALAMIN) 1000 MCG tablet Take 1,000 mcg by mouth daily.   Yes [provider]  cephALEXin (KEFLEX) 500 MG capsule Take 1 capsule (500 mg total) by mouth 4 (four) times daily. Patient not taking: No sig reported 04/20/18   Bethann BerkshireZammit, Joseph, MD    Physical Exam: Vitals:   09/01/20 1830 09/01/20 2028 09/01/20 2130 09/01/20 2230  BP: 130/74 (!) 143/70 (!) 141/67 134/71  Pulse: (!) 57 (!) 52 (!) 52 85  Resp:  19 19 20  (!) 23  Temp:      TempSrc:      SpO2: 100% 100% 96% 98%  Weight:      Height:        Physical Exam Constitutional:      General: He is not in acute distress. HENT:     Head: Normocephalic.  Eyes:     Extraocular Movements: Extraocular movements intact.     Conjunctiva/sclera: Conjunctivae normal.  Cardiovascular:     Rate and Rhythm: Normal rate and regular rhythm.     Pulses: Normal pulses.  Pulmonary:     Effort: Pulmonary effort is normal. No respiratory  distress.     Breath sounds: Normal breath sounds. No wheezing or rales.  Abdominal:     General: Bowel sounds are normal. There is no distension.     Palpations: Abdomen is soft.     Tenderness: There is no abdominal tenderness.  Musculoskeletal:        General: No swelling or tenderness.     Cervical back: Normal range of motion and neck supple.  Skin:    General: Skin is warm and dry.  Neurological:     General: No focal deficit present.     Mental Status: He is alert.     Comments: Oriented to self only     Labs on Admission: I have personally reviewed following labs and imaging studies  CBC: Recent Labs  Lab 09/01/20 1521  WBC 9.7  NEUTROABS 5.9  HGB 11.1*  HCT 34.9*  MCV 90.9  PLT 221   Basic Metabolic Panel: Recent Labs  Lab 09/01/20 1521  NA 131*  K 4.0  CL 96*  CO2 24  GLUCOSE 107*  BUN 15  CREATININE 1.11  CALCIUM 8.9  MG 2.0   GFR: Estimated Creatinine Clearance: 51.4 mL/min (by C-G formula based on SCr of 1.11 mg/dL). Liver Function Tests: No results for input(s): AST, ALT, ALKPHOS, BILITOT, PROT, ALBUMIN in the last 168 hours. No results for input(s): LIPASE, AMYLASE in the last 168 hours. No results for input(s): AMMONIA in the last 168 hours. Coagulation Profile: No results for input(s): INR, PROTIME in the last 168 hours. Cardiac Enzymes: No results for input(s): CKTOTAL, CKMB, CKMBINDEX, TROPONINI in the last 168 hours. BNP (last 3 results) No results for input(s): PROBNP in the last 8760 hours. HbA1C: No results for input(s): HGBA1C in the last 72 hours. CBG: Recent Labs  Lab 09/01/20 1517  GLUCAP 94   Lipid Profile: No results for input(s): CHOL, HDL, LDLCALC, TRIG, CHOLHDL, LDLDIRECT in the last 72 hours. Thyroid Function Tests: No results for input(s): TSH, T4TOTAL, FREET4, T3FREE, THYROIDAB in the last 72 hours. Anemia Panel: No results for input(s): VITAMINB12, FOLATE, FERRITIN, TIBC, IRON, RETICCTPCT in the last 72  hours. Urine analysis:    Component Value Date/Time   COLORURINE STRAW (A) 09/15/2018 1455   APPEARANCEUR CLEAR 09/15/2018 1455   LABSPEC 1.006 09/15/2018 1455   PHURINE 8.0 09/15/2018 1455   GLUCOSEU NEGATIVE 09/15/2018 1455   HGBUR NEGATIVE 09/15/2018 1455   BILIRUBINUR NEGATIVE 09/15/2018 1455   KETONESUR NEGATIVE 09/15/2018 1455   PROTEINUR NEGATIVE 09/15/2018 1455   NITRITE NEGATIVE 09/15/2018 1455   LEUKOCYTESUR NEGATIVE 09/15/2018 1455    Radiological Exams on Admission: CT Head Wo Contrast  Result Date: 09/01/2020 CLINICAL DATA:  Altered mental status. EXAM: CT HEAD WITHOUT CONTRAST TECHNIQUE: Contiguous axial images were obtained from the base of the skull through the vertex without intravenous contrast. COMPARISON:  September 15, 2018. FINDINGS: Brain: Mild chronic ischemic white matter disease is noted. No mass effect or midline shift is noted. Ventricular size is within normal limits. There is no evidence of mass lesion, hemorrhage or acute infarction. Vascular: No hyperdense vessel or unexpected calcification. Skull: Normal. Negative for fracture or focal lesion. Sinuses/Orbits: Bilateral ethmoid and maxillary sinusitis is noted. Other: None. IMPRESSION: Mild chronic ischemic white matter disease. Bilateral ethmoid and maxillary sinusitis. No acute intracranial abnormality seen. Electronically Signed   By: Lupita Raider M.D.   On: 09/01/2020 17:30   DG Chest Portable 1 View  Result Date: 09/01/2020 CLINICAL DATA:  Syncope. EXAM: PORTABLE CHEST 1 VIEW COMPARISON:  None. FINDINGS: The heart size and mediastinal contours are within normal limits. Both lungs are clear. No pneumothorax or pleural effusion is noted. The visualized skeletal structures are unremarkable. IMPRESSION: No active disease. Electronically Signed   By: Lupita Raider M.D.   On: 09/01/2020 15:44    EKG: Independently reviewed.  Sinus bradycardia (heart rate 58) with first-degree AV block.  Slight ST  elevations in inferior and lateral leads similar to prior tracing from January 2020.  Assessment/Plan Principal Problem:   Syncope Active Problems:   Fall   Bradycardia   Dementia (HCC)   Essential hypertension   Syncope:  Differentials include cardiogenic cause (arrhythmia versus structural heart disease), vasovagal, orthostatic, and possible seizure.  Patient found to be hypotensive with blood pressure 80/60 at the time of the syncopal event in the ED.  Slightly bradycardic with heart rate in the 50s.  Blood pressure improved quickly on its own without patient being given any additional IV fluid boluses.  Orthostatics were checked -blood pressure did drop from 152/77 supine to 132/70 standing, however, heart rate decreased from 74 supine to 59 standing.  He is not on any anticoagulant or antiplatelet agents.  Head CT negative for acute intracranial abnormality.  Infectious etiology less likely given no fever or leukocytosis.  SARS-CoV-2 PCR test and influenza panel both negative.  Chest x-ray showing no active disease.  PE less likely given no tachycardia or hypoxia.  ACS less likely as high-sensitivity troponin negative. -Continue cardiac monitoring.  Echocardiogram ordered.  IV fluid hydration and repeat orthostatics in the morning.  EEG and carotid dopplers ordered.  Check D-dimer level.  Check second set of troponin.  Fall: Reportedly had a witnessed fall at his nursing home with no loss of consciousness at that time. -PT/OT, fall precautions  Sinus bradycardia with first-degree AV block: Not on a beta-blocker or calcium channel blocker.  Slightly bradycardic with heart rate in the 50s, EKG showing first-degree AV block. -Continue cardiac monitoring, check TSH level.  Avoid AV nodal blocking agents.  Mild hyponatremia: Sodium 131.  -IV fluid hydration.  Check serum osmolarity and repeat BMP in a.m.  Advanced dementia -Continue Aricept, delirium precautions  Hypertension -Hold  antihypertensives at this time  Bilateral ethmoid and maxillary sinusitis: Seen on CT.  No fever or leukocytosis.  Patient is not endorsing any rhinorrhea/purulent nasal discharge or sinus pain/pressure, although history limited due to his advanced dementia.  Unclear if he has recently been treated with antibiotics at his nursing home. -Symptomatic management: Flonase nasal spray.  Consider starting antibiotics if there is no improvement.  DVT prophylaxis: Lovenox Code Status: Full code Family Communication: No family available at this time. Disposition Plan: Status is: Observation  The patient remains OBS appropriate and will d/c before 2 midnights.  Dispo: The patient is from: Nursing  home              Anticipated d/c is to: Nursing home              Anticipated d/c date is: 2 days              Patient currently is not medically stable to d/c.  The medical decision making on this patient was of high complexity and the patient is at high risk for clinical deterioration, therefore this is a level 3 visit.  John Giovanni MD [facility]  If 7PM-7AM, please contact night-coverage [email or web address]  09/01/2020, 10:40 PM

## 2020-09-01 NOTE — ED Notes (Addendum)
Pt got out of bed and was walking in the hall way when Pt was at the nursing station leaning on the edge where pt became weak, shaky and unresponsive, where pt was then place into chair and back into bed, CBG & Vitals were obtain where pt was hypotensive & bradycardiac . MD was then called to bedside for further evaluation. Orders obtain for labs , EKG and an bolus of NS to be administered  1632 This RN updated pt NP- Annice Pih on pt status and condition   2323 Pt keeps pulling off ED equipment such as cardiac monitor, pulse ox and BP

## 2020-09-01 NOTE — ED Triage Notes (Signed)
To ED via [facility] from [facility] nursing home- dementia unit, staff there stated pt fell onto face , no bruising, no complaints on arrival--  Pt has a colostomy bag that is full-- emptied on arrival for approx 600cc liquid-ish stool . Denies any complaints

## 2020-09-01 NOTE — ED Notes (Signed)
Ambulated with  Minimal assist -- Dr. Denton Lank made aware

## 2020-09-01 NOTE — ED Notes (Signed)
PTAR called @ 1416-per Clydie Braun, RN called by Marylene Land

## 2020-09-02 ENCOUNTER — Observation Stay (HOSPITAL_COMMUNITY): Payer: Medicare Other

## 2020-09-02 ENCOUNTER — Observation Stay (HOSPITAL_BASED_OUTPATIENT_CLINIC_OR_DEPARTMENT_OTHER): Payer: Medicare Other

## 2020-09-02 DIAGNOSIS — M2550 Pain in unspecified joint: Secondary | ICD-10-CM | POA: Diagnosis not present

## 2020-09-02 DIAGNOSIS — I10 Essential (primary) hypertension: Secondary | ICD-10-CM | POA: Diagnosis not present

## 2020-09-02 DIAGNOSIS — I361 Nonrheumatic tricuspid (valve) insufficiency: Secondary | ICD-10-CM

## 2020-09-02 DIAGNOSIS — R55 Syncope and collapse: Secondary | ICD-10-CM | POA: Diagnosis not present

## 2020-09-02 DIAGNOSIS — F0390 Unspecified dementia without behavioral disturbance: Secondary | ICD-10-CM | POA: Diagnosis not present

## 2020-09-02 DIAGNOSIS — Z7401 Bed confinement status: Secondary | ICD-10-CM | POA: Diagnosis not present

## 2020-09-02 LAB — BASIC METABOLIC PANEL
Anion gap: 10 (ref 5–15)
BUN: 12 mg/dL (ref 8–23)
CO2: 25 mmol/L (ref 22–32)
Calcium: 8.7 mg/dL — ABNORMAL LOW (ref 8.9–10.3)
Chloride: 98 mmol/L (ref 98–111)
Creatinine, Ser: 0.86 mg/dL (ref 0.61–1.24)
GFR, Estimated: >60 mL/min (ref >60)
Glucose, Bld: 90 mg/dL (ref 70–99)
Potassium: 4 mmol/L (ref 3.5–5.1)
Sodium: 133 mmol/L — ABNORMAL LOW (ref 135–145)

## 2020-09-02 LAB — ECHOCARDIOGRAM COMPLETE
Area-P 1/2: 2.69 cm2
Calc EF: 65.1 %
Height: 62 in
S' Lateral: 2.5 cm
Single Plane A2C EF: 60.9 %
Single Plane A4C EF: 68.3 %
Weight: 3001.6 oz

## 2020-09-02 LAB — RESP PANEL BY RT-PCR (FLU A&B, COVID) ARPGX2
Influenza A by PCR: NEGATIVE
Influenza B by PCR: NEGATIVE
SARS Coronavirus 2 by RT PCR: NEGATIVE

## 2020-09-02 LAB — TROPONIN I (HIGH SENSITIVITY): Troponin I (High Sensitivity): 5 ng/L (ref <18)

## 2020-09-02 LAB — D-DIMER, QUANTITATIVE: D-Dimer, Quant: 0.58 ug/mL-FEU — ABNORMAL HIGH (ref 0.00–0.50)

## 2020-09-02 LAB — OSMOLALITY: Osmolality: 283 mOsm/kg (ref 275–295)

## 2020-09-02 LAB — TSH: TSH: 2.365 u[IU]/mL (ref 0.350–4.500)

## 2020-09-02 MED ORDER — PERFLUTREN LIPID MICROSPHERE
1.0000 mL | INTRAVENOUS | Status: AC | PRN
  Administered 2020-09-02: 08:00:00 2 mL via INTRAVENOUS
  Filled 2020-09-02: qty 10

## 2020-09-02 MED ORDER — HALOPERIDOL LACTATE 5 MG/ML IJ SOLN
2.0000 mg | Freq: Four times a day (QID) | INTRAMUSCULAR | Status: DC | PRN
  Administered 2020-09-02: 12:00:00 2 mg via INTRAVENOUS
  Filled 2020-09-02: qty 1

## 2020-09-02 NOTE — Procedures (Signed)
Patient Name: Wyatt King  MRN: [id number]  Epilepsy Attending: Charlsie Quest  Referring Physician/Provider: Dr Eligha Bridegroom Date: 09/02/2020 Duration: 29.20 mins  Patient history: 84yo F with syncope. EEG to evaluate for seizure  Level of alertness: Awake  AEDs during EEG study: None  Technical aspects: This EEG study was done with scalp electrodes positioned according to the 10-20 International system of electrode placement. Electrical activity was acquired at a sampling rate of 500Hz  and reviewed with a high frequency filter of 70Hz  and a low frequency filter of 1Hz . EEG data were recorded continuously and digitally stored.   Description: No posterior dominant rhythm was seen. EEG showed continuous generalized 5 to 6 Hz theta slowing.  Hyperventilation and photic stimulation were not performed.     ABNORMALITY -Continuous slow, generalized  IMPRESSION: This study suggestive of moderate diffuse encephalopathy, nonspecific etiology. No seizures or epileptiform discharges were seen throughout the recording.  Deontrey Massi 

## 2020-09-02 NOTE — Consult Note (Signed)
WOC Nurse ostomy follow up Patient receiving care in [facility] 6E8. Stoma type/location: LUQ, possibly an ileostomy Stomal assessment/size: 1 3/8 inches flat, moist, producing liquid brown stool Peristomal assessment: intact, no breakdown Treatment options for stomal/peristomal skin: barrier ring Output: liquid brown stool  Ostomy pouching: 1pc. Pouch, Lawson # [phone] and barrier rings, [place] [phone] ordered. Education provided: none, patient has dementia A flat, 2 piece 2 and 3/4 inch pouch and barrier ring placed. Helmut Muster, RN, MSN, CWOCN, CNS-BC, pager [phone]

## 2020-09-02 NOTE — Evaluation (Signed)
Physical Therapy Evaluation Patient Details Name: Wyatt King MRN: [id number] DOB: March 18, 1936 Today's Date: 09/02/2020   History of Present Illness  Pt adm from NH after witnessed fall without LOC. In ED pt had syncopal/near syncopal episode while walking  and pt found to be hypotensive. PMH - advanced dementia, HTN, colostomy.  Clinical Impression  Pt presents to PT with slightly unsteady gait due to illness and inactivity. Pt likely close to baseline with mobility. Will follow acutely but do not feel pt will need PT after DC. No signs of presyncope/syncope with ambulation.      Follow Up Recommendations No PT follow up    Equipment Recommendations  None recommended by PT    Recommendations for Other Services       Precautions / Restrictions Precautions Precautions: Fall      Mobility  Bed Mobility Overal bed mobility: Modified Independent                  Transfers Overall transfer level: Needs assistance Equipment used: None Transfers: Sit to/from Stand Sit to Stand: Min guard            Ambulation/Gait Ambulation/Gait assistance: Min guard Gait Distance (Feet): 150 Feet Assistive device: 1 person hand held assist Gait Pattern/deviations: Step-through pattern;Decreased stride length Gait velocity: decr Gait velocity interpretation: <1.31 ft/sec, indicative of household ambulator General Gait Details: Assist for safety. No signs of near syncope/syncope.  Stairs            Wheelchair Mobility    Modified Rankin (Stroke Patients Only)       Balance Overall balance assessment: Mild deficits observed, not formally tested                                           Pertinent Vitals/Pain Pain Assessment: No/denies pain    Home Living Family/patient expects to be discharged to:: Skilled nursing facility                 Additional Comments: dementia unit    Prior Function Level of Independence: Needs assistance    Gait / Transfers Assistance Needed: Pt unable to relate but likely modified independent without assistive device           Hand Dominance        Extremity/Trunk Assessment   Upper Extremity Assessment Upper Extremity Assessment: Defer to OT evaluation    Lower Extremity Assessment Lower Extremity Assessment: Overall WFL for tasks assessed       Communication   Communication: No difficulties  Cognition Arousal/Alertness: Awake/alert Behavior During Therapy: WFL for tasks assessed/performed Overall Cognitive Status: History of cognitive impairments - at baseline                                        General Comments      Exercises     Assessment/Plan    PT Assessment Patient needs continued PT services  PT Problem List Decreased mobility       PT Treatment Interventions Gait training;Functional mobility training;Therapeutic activities;Therapeutic exercise;Balance training;Patient/family education    PT Goals (Current goals can be found in the Care Plan section)  Acute Rehab PT Goals Patient Stated Goal: Pt unable PT Goal Formulation: Patient unable to participate in goal setting Time For Goal Achievement: 09/09/20 Potential to  Achieve Goals: Good    Frequency Min 3X/week   Barriers to discharge        Co-evaluation               AM-PAC PT "6 Clicks" Mobility  Outcome Measure Help needed turning from your back to your side while in a flat bed without using bedrails?: None Help needed moving from lying on your back to sitting on the side of a flat bed without using bedrails?: None Help needed moving to and from a bed to a chair (including a wheelchair)?: A Little Help needed standing up from a chair using your arms (e.g., wheelchair or bedside chair)?: A Little Help needed to walk in hospital room?: A Little Help needed climbing 3-5 steps with a railing? : A Little 6 Click Score: 20    End of Session Equipment Utilized  During Treatment: Gait belt Activity Tolerance: Patient tolerated treatment well Patient left: in chair;with call bell/phone within reach;with chair alarm set Nurse Communication: Mobility status PT Visit Diagnosis: Other abnormalities of gait and mobility (R26.89)    Time: 4818-5631 PT Time Calculation (min) (ACUTE ONLY): 14 min   Charges:   PT Evaluation $PT Eval Low Complexity: 1 Low          [facility] PT Acute Rehabilitation Services Pager [phone] Office [phone]   [name] [facility] 09/02/2020, 11:00 AM

## 2020-09-02 NOTE — Progress Notes (Signed)
EEG complete - results pending 

## 2020-09-02 NOTE — Progress Notes (Signed)
Report called to [facility], Charity fundraiser at [facility] nursing facility.

## 2020-09-02 NOTE — Plan of Care (Signed)

## 2020-09-02 NOTE — Evaluation (Signed)
Occupational Therapy Evaluation Patient Details Name: Wyatt King MRN: [id number] DOB: 1936/01/27 Today's Date: 09/02/2020    History of Present Illness Pt adm from NH after witnessed fall without LOC. In ED pt had syncopal/near syncopal episode while walking  and pt found to be hypotensive. PMH - advanced dementia, HTN, colostomy.   Clinical Impression   Patient admitted for the above diagnosis.  Appears to be medically stable to return to his SNF.   Orthostatics taken: Supine: 151/71 Sit: 147/69 Stand: 122/66 Asymptomatic with no dizziness reported.   Recommend follow up as directed by OT at [facility] for any care plan changes.   No acute needs identified in the acute setting.      Follow Up Recommendations  SNF    Equipment Recommendations  None recommended by OT    Recommendations for Other Services       Precautions / Restrictions Precautions Precautions: Fall Restrictions Weight Bearing Restrictions: No      Mobility Bed Mobility Overal bed mobility: Needs Assistance Bed Mobility: Supine to Sit;Sit to Supine     Supine to sit: Supervision Sit to supine: Supervision        Transfers Overall transfer level: Needs assistance Equipment used: None Transfers: Sit to/from Stand Sit to Stand: Min guard         General transfer comment: walked in the ahlls with min Guard    Balance Overall balance assessment: Mild deficits observed, not formally tested                                         ADL either performed or assessed with clinical judgement   ADL Overall ADL's : At baseline                                       General ADL Comments: Able to place and remove socks when asked.  Able to walk to toilet with Min Guard     Vision Patient Visual Report: No change from baseline       Perception     Praxis      Pertinent Vitals/Pain Pain Assessment: No/denies pain     Hand Dominance     Extremity/Trunk  Assessment Upper Extremity Assessment Upper Extremity Assessment: Overall WFL for tasks assessed           Communication Communication Communication: No difficulties   Cognition Arousal/Alertness: Awake/alert Behavior During Therapy: WFL for tasks assessed/performed Overall Cognitive Status: History of cognitive impairments - at baseline                                     General Comments   see vitals above    Exercises     Shoulder Instructions      Home Living Family/patient expects to be discharged to:: Skilled nursing facility                                 Additional Comments: dementia unit      Prior Functioning/Environment Level of Independence: Needs assistance  Gait / Transfers Assistance Needed: Patient states he does not use RW when showed one. ADL's / Homemaking Assistance Needed: States staff helps  him a little bit. Communication / Swallowing Assistance Needed: WFLs Comments: Dementia        OT Problem List: Impaired balance (sitting and/or standing);Decreased cognition      OT Treatment/Interventions:      OT Goals(Current goals can be found in the care plan section) Acute Rehab OT Goals Patient Stated Goal: Pt unable OT Goal Formulation: Patient unable to participate in goal setting Time For Goal Achievement: 09/02/20 Potential to Achieve Goals: Good  OT Frequency:     Barriers to D/C:  none noted          Co-evaluation              AM-PAC OT "6 Clicks" Daily Activity     Outcome Measure Help from another person eating meals?: None Help from another person taking care of personal grooming?: A Little Help from another person toileting, which includes using toliet, bedpan, or urinal?: A Little Help from another person bathing (including washing, rinsing, drying)?: A Little Help from another person to put on and taking off regular upper body clothing?: A Little Help from another person to put on and  taking off regular lower body clothing?: A Little 6 Click Score: 19   End of Session Equipment Utilized During Treatment: Gait belt Nurse Communication: Mobility status  Activity Tolerance: Patient tolerated treatment well Patient left: in bed  OT Visit Diagnosis: Unsteadiness on feet (R26.81)                Time: 1610-9604 OT Time Calculation (min): 16 min Charges:  OT General Charges $OT Visit: 1 Visit OT Evaluation $OT Eval Moderate Complexity: 1 Mod  09/02/2020  Rich, OTR/L  Acute Rehabilitation Services  Office:  [phone]   Suzanna Obey 09/02/2020, 3:11 PM

## 2020-09-02 NOTE — Discharge Summary (Addendum)
Physician Discharge Summary  [name] XFG:[id number] DOB: 06-Dec-1935 DOA: 09/01/2020  PCP: Darrow Bussing, MD  Admit date: 09/01/2020 Discharge date: 09/02/2020  Admitted From: SNF Disposition:  SNF  Recommendations for Outpatient Follow-up:  1. Follow up with PCP in 1-2 weeks 2. Recommend repeat BP within 3-5 days and titrate BP meds, avoiding hypotension  Discharge Condition:Stable CODE STATUS:Full Diet recommendation: Regular   Brief/Interim Summary: 84 y.o. male with medical history significant of advanced dementia, hypertension being brought to the ED via EMS after he had a witnessed fall at his nursing home.  No loss of consciousness reported.  He is not on any anticoagulant or antiplatelet agents.  History provided by patient limited due to his advanced dementia.  He is oriented to self only and does not remember falling.  Denies dizziness, chest pain, shortness of breath, or abdominal pain.  After initial evaluation in the ED, patient was felt to be stable for discharge.  However, while waiting for PTAR, patient was ambulating and became lightheaded and had a short episode of syncope or near syncope.  He was laid into the bed immediately.  No significant fall or head trauma witnessed.  When ED provider arrived to see the patient he was awake, however, hypotensive with blood pressure 80/60 and slightly bradycardic with heart rate in the 50s.  Blood pressure improved quickly on its own prior to patient being given any IV fluid boluses.  Orthostatics were checked -blood pressure did drop from 152/77 supine to 132/70 standing, however, heart rate decreased from 74 supine to 59 standing.  Admission requested for syncope work-up.  Discharge Diagnoses:  Principal Problem:   Syncope Active Problems:   Fall   Bradycardia   Dementia (HCC)   Essential hypertension   Syncope:   -Patient found to be hypotensive with blood pressure 80/60 at the time of the syncopal event in the ED.   Slightly bradycardic with heart rate in the 50s.  Blood pressure improved quickly on its own without patient being given any additional IV fluid boluses.   -Pt was mildly orthostatic on presentation -BP meds were held and bp normalized -EEG reviewed, neg for seizures. Head CT unremarkable. 2d echo reviewed and was unremarkable. B carotid dopplers reviewed, no significant carotid stenosis -Pt was evaluated by PT and was noted to be at his baseline -Pt remained medically stable -Recommend holding home norvasc and cont on low dose home lisinopril monotherapy for BP, continue to titrate BP meds, avoiding hypotension  Fall:  -Reportedly had a witnessed fall at his nursing home with no loss of consciousness at that time. -seen by therapy with no PT follow up needs identified  Sinus bradycardia with first-degree AV block:  -Not on a beta-blocker or calcium channel blocker.  -Remained stable on tele.  Mild hyponatremia: Sodium 131 on presentation, up to 133 by time of d/c  -Was given IV fluid hydration.   Advanced dementia -Continue Aricept, delirium precautions  Hypertension -Hold antihypertensives at this time  Bilateral ethmoid and maxillary sinusitis:  -Seen on CT.  No fever or leukocytosis.  Patient is not endorsing any rhinorrhea/purulent nasal discharge or sinus pain/pressure, although history limited due to his advanced dementia.  Unclear if he has recently been treated with antibiotics at his nursing home. -Continue symptomatic management: Given Flonase nasal spray.  Discharge Instructions   Allergies as of 09/02/2020   No Known Allergies     Medication List    STOP taking these medications   amLODipine 5 MG tablet  Commonly known as: NORVASC   cephALEXin 500 MG capsule Commonly known as: KEFLEX     TAKE these medications   donepezil 10 MG tablet Commonly known as: ARICEPT Take 10 mg by mouth at bedtime.   lisinopril 2.5 MG tablet Commonly known as:  ZESTRIL Take 2.5 mg by mouth daily.   vitamin B-12 1000 MCG tablet Commonly known as: CYANOCOBALAMIN Take 1,000 mcg by mouth daily.       No Known Allergies  Procedures/Studies: CT Head Wo Contrast  Result Date: 09/01/2020 CLINICAL DATA:  Altered mental status. EXAM: CT HEAD WITHOUT CONTRAST TECHNIQUE: Contiguous axial images were obtained from the base of the skull through the vertex without intravenous contrast. COMPARISON:  September 15, 2018. FINDINGS: Brain: Mild chronic ischemic white matter disease is noted. No mass effect or midline shift is noted. Ventricular size is within normal limits. There is no evidence of mass lesion, hemorrhage or acute infarction. Vascular: No hyperdense vessel or unexpected calcification. Skull: Normal. Negative for fracture or focal lesion. Sinuses/Orbits: Bilateral ethmoid and maxillary sinusitis is noted. Other: None. IMPRESSION: Mild chronic ischemic white matter disease. Bilateral ethmoid and maxillary sinusitis. No acute intracranial abnormality seen. Electronically Signed   By: Lupita Raider M.D.   On: 09/01/2020 17:30   DG Chest Portable 1 View  Result Date: 09/01/2020 CLINICAL DATA:  Syncope. EXAM: PORTABLE CHEST 1 VIEW COMPARISON:  None. FINDINGS: The heart size and mediastinal contours are within normal limits. Both lungs are clear. No pneumothorax or pleural effusion is noted. The visualized skeletal structures are unremarkable. IMPRESSION: No active disease. Electronically Signed   By: Lupita Raider M.D.   On: 09/01/2020 15:44   EEG adult  Result Date: 09/02/2020 Charlsie Quest, MD     09/02/2020 11:49 AM Patient Name: Wyatt King MRN: [id number] Epilepsy Attending: Charlsie Quest Referring Physician/Provider: Dr Eligha Bridegroom Date: 09/02/2020 Duration: 29.20 mins Patient history: 84yo F with syncope. EEG to evaluate for seizure Level of alertness: Awake AEDs during EEG study: None Technical aspects: This EEG study was done with  scalp electrodes positioned according to the 10-20 International system of electrode placement. Electrical activity was acquired at a sampling rate of 500Hz  and reviewed with a high frequency filter of 70Hz  and a low frequency filter of 1Hz . EEG data were recorded continuously and digitally stored. Description: No posterior dominant rhythm was seen. EEG showed continuous generalized 5 to 6 Hz theta slowing.  Hyperventilation and photic stimulation were not performed.   ABNORMALITY -Continuous slow, generalized IMPRESSION: This study suggestive of moderate diffuse encephalopathy, nonspecific etiology. No seizures or epileptiform discharges were seen throughout the recording.   ECHOCARDIOGRAM COMPLETE  Result Date: 09/02/2020    ECHOCARDIOGRAM REPORT   Patient Name:   NOTNAMED SCHOLZ Date of Exam: 09/02/2020 Medical Rec #:  09/04/2020      Height:       62.0 in Accession #:    Kandis Cocking     Weight:       187.6 lb Date of Birth:  [birth year]      BSA:          1.860 m Patient Age:    84 years       BP:           149/68 mmHg Patient Gender: M              HR:           50 bpm. Exam Location:  Inpatient  Procedure: 2D Echo, Color Doppler, Cardiac Doppler and Intracardiac            Opacification Agent Indications:    Syncope 780.2 / R55  History:        Patient has no prior history of Echocardiogram examinations.                 Risk Factors:Hypertension.  Sonographer:    Leta Junglingiffany Cooper RDCS Referring Phys: [id number] VASUNDHRA RATHORE IMPRESSIONS  1. Left ventricular ejection fraction, by estimation, is 60 to 65%. The left ventricle has normal function. The left ventricle has no regional wall motion abnormalities. Left ventricular diastolic parameters are consistent with Grade I diastolic dysfunction (impaired relaxation).  2. Right ventricular systolic function is normal. The right ventricular size is normal.  3. The mitral valve is normal in structure. No evidence of mitral valve regurgitation. No  evidence of mitral stenosis.  4. The aortic valve is tricuspid. Aortic valve regurgitation is trivial. Mild to moderate aortic valve sclerosis/calcification is present, without any evidence of aortic stenosis.  5. The inferior vena cava is normal in size with greater than 50% respiratory variability, suggesting right atrial pressure of 3 mmHg. FINDINGS  Left Ventricle: Left ventricular ejection fraction, by estimation, is 60 to 65%. The left ventricle has normal function. The left ventricle has no regional wall motion abnormalities. Definity contrast agent was given IV to delineate the left ventricular  endocardial borders. The left ventricular internal cavity size was normal in size. There is no left ventricular hypertrophy. Left ventricular diastolic parameters are consistent with Grade I diastolic dysfunction (impaired relaxation). Right Ventricle: The right ventricular size is normal. No increase in right ventricular wall thickness. Right ventricular systolic function is normal. Left Atrium: Left atrial size was normal in size. Right Atrium: Right atrial size was normal in size. Pericardium: There is no evidence of pericardial effusion. Mitral Valve: The mitral valve is normal in structure. No evidence of mitral valve regurgitation. No evidence of mitral valve stenosis. Tricuspid Valve: The tricuspid valve is normal in structure. Tricuspid valve regurgitation is mild . No evidence of tricuspid stenosis. Aortic Valve: The aortic valve is tricuspid. Aortic valve regurgitation is trivial. Mild to moderate aortic valve sclerosis/calcification is present, without any evidence of aortic stenosis. Pulmonic Valve: The pulmonic valve was normal in structure. Pulmonic valve regurgitation is not visualized. No evidence of pulmonic stenosis. Aorta: The aortic root is normal in size and structure. Venous: The inferior vena cava is normal in size with greater than 50% respiratory variability, suggesting right atrial pressure  of 3 mmHg. IAS/Shunts: No atrial level shunt detected by color flow Doppler.  LEFT VENTRICLE PLAX 2D LVIDd:         4.10 cm      Diastology LVIDs:         2.50 cm      LV e' medial:    6.22 cm/s LV PW:         0.90 cm      LV E/e' medial:  13.4 LV IVS:        1.40 cm      LV e' lateral:   8.85 cm/s LVOT diam:     1.90 cm      LV E/e' lateral: 9.4 LV SV:         68 LV SV Index:   36 LVOT Area:     2.84 cm  LV Volumes (MOD) LV vol d, MOD A2C: 145.0 ml LV vol d, MOD  A4C: 132.0 ml LV vol s, MOD A2C: 56.7 ml LV vol s, MOD A4C: 41.8 ml LV SV MOD A2C:     88.3 ml LV SV MOD A4C:     132.0 ml LV SV MOD BP:      94.3 ml RIGHT VENTRICLE RV S prime:     10.70 cm/s TAPSE (M-mode): 1.9 cm LEFT ATRIUM             Index       RIGHT ATRIUM          Index LA diam:        2.50 cm 1.34 cm/m  RA Area:     9.97 cm LA Vol (A2C):   33.8 ml 18.17 ml/m RA Volume:   19.20 ml 10.32 ml/m LA Vol (A4C):   38.3 ml 20.59 ml/m LA Biplane Vol: 36.4 ml 19.57 ml/m  AORTIC VALVE LVOT Vmax:   117.00 cm/s LVOT Vmean:  65.900 cm/s LVOT VTI:    0.239 m  AORTA Ao Root diam: 3.40 cm Ao Asc diam:  3.60 cm MITRAL VALVE MV Area (PHT): 2.69 cm     SHUNTS MV Decel Time: 282 msec     Systemic VTI:  0.24 m MV E velocity: 83.60 cm/s   Systemic Diam: 1.90 cm MV A velocity: 101.00 cm/s MV E/A ratio:  0.83 Donato Schultz MD Electronically signed by Donato Schultz MD Signature Date/Time: 09/02/2020/10:44:18 AM    Final     Subjective: Pleasantly confused  Discharge Exam: Vitals:   09/02/20 0434 09/02/20 1037  BP: (!) 149/68 (!) 133/112  Pulse: (!) 52 (!) 50  Resp: 18 18  Temp: 98.8 F (37.1 C) (!) 97.2 F (36.2 C)  SpO2: 98%    Vitals:   09/01/20 2328 09/02/20 0043 09/02/20 0434 09/02/20 1037  BP: (!) 128/97 140/70 (!) 149/68 (!) 133/112  Pulse: 62 (!) 53 (!) 52 (!) 50  Resp: (!) Temp: 98.4 F (36.9 C) 98.6 F (37 C) 98.8 F (37.1 C) (!) 97.2 F (36.2 C)  TempSrc:  Oral Oral Oral  SpO2: 98% 97% 98%   Weight:  85.1 kg     Height:        General: Pt is alert, awake, not in acute distress Cardiovascular: RRR, S1/S2 +, no rubs, no gallops Respiratory: CTA bilaterally, no wheezing, no rhonchi Abdominal: Soft, NT, ND, bowel sounds + Extremities: no edema, no cyanosis   The results of significant diagnostics from this hospitalization (including imaging, microbiology, ancillary and laboratory) are listed below for reference.     Microbiology: Recent Results (from the past 240 hour(s))  Resp Panel by RT-PCR (Flu A&B, Covid) Nasopharyngeal Swab     Status: None   Collection Time: 09/01/20  3:22 PM   Specimen: Nasopharyngeal Swab; Nasopharyngeal(NP) swabs in vial transport medium  Result Value Ref Range Status   SARS Coronavirus 2 by RT PCR NEGATIVE NEGATIVE Final    Comment: (NOTE) SARS-CoV-2 target nucleic acids are NOT DETECTED.  The SARS-CoV-2 RNA is generally detectable in upper respiratory specimens during the acute phase of infection. The lowest concentration of SARS-CoV-2 viral copies this assay can detect is 138 copies/mL. A negative result does not preclude SARS-Cov-2 infection and should not be used as the sole basis for treatment or other patient management decisions. A negative result may occur with  improper specimen collection/handling, submission of specimen other than nasopharyngeal swab, presence of viral mutation(s) within the areas targeted by this assay, and  inadequate number of viral copies(<138 copies/mL). A negative result must be combined with clinical observations, patient history, and epidemiological information. The expected result is Negative.  Fact Sheet for Patients:  BloggerCourse.com  Fact Sheet for Healthcare Providers:  SeriousBroker.it  This test is no t yet approved or cleared by the [place] FDA and  has been authorized for detection and/or diagnosis of SARS-CoV-2 by FDA under an Emergency Use Authorization  (EUA). This EUA will remain  in effect (meaning this test can be used) for the duration of the COVID-19 declaration under Section 564(b)(1) of the Act, 21 U.S.C.section 360bbb-3(b)(1), unless the authorization is terminated  or revoked sooner.       Influenza A by PCR NEGATIVE NEGATIVE Final   Influenza B by PCR NEGATIVE NEGATIVE Final    Comment: (NOTE) The Xpert Xpress SARS-CoV-2/FLU/RSV plus assay is intended as an aid in the diagnosis of influenza from Nasopharyngeal swab specimens and should not be used as a sole basis for treatment. Nasal washings and aspirates are unacceptable for Xpert Xpress SARS-CoV-2/FLU/RSV testing.  Fact Sheet for Patients: BloggerCourse.com  Fact Sheet for Healthcare Providers: SeriousBroker.it  This test is not yet approved or cleared by the [place] FDA and has been authorized for detection and/or diagnosis of SARS-CoV-2 by FDA under an Emergency Use Authorization (EUA). This EUA will remain in effect (meaning this test can be used) for the duration of the COVID-19 declaration under Section 564(b)(1) of the Act, 21 U.S.C. section 360bbb-3(b)(1), unless the authorization is terminated or revoked.  Performed at [facility], [address] [place], [place] [address]      Labs: BNP (last 3 results) No results for input(s): BNP in the last 8760 hours. Basic Metabolic Panel: Recent Labs  Lab 09/01/20 1521 09/02/20 0158  NA 131* 133*  K 4.0 4.0  CL 96* 98  CO2 24 25  GLUCOSE 107* 90  BUN 15 12  CREATININE 1.11 0.86  CALCIUM 8.9 8.7*  MG 2.0  --    Liver Function Tests: No results for input(s): AST, ALT, ALKPHOS, BILITOT, PROT, ALBUMIN in the last 168 hours. No results for input(s): LIPASE, AMYLASE in the last 168 hours. No results for input(s): AMMONIA in the last 168 hours. CBC: Recent Labs  Lab 09/01/20 1521  WBC 9.7  NEUTROABS 5.9  HGB 11.1*  HCT 34.9*  MCV 90.9   PLT 221   Cardiac Enzymes: No results for input(s): CKTOTAL, CKMB, CKMBINDEX, TROPONINI in the last 168 hours. BNP: Invalid input(s): POCBNP CBG: Recent Labs  Lab 09/01/20 1517  GLUCAP 94   D-Dimer Recent Labs    09/02/20 0158  DDIMER 0.58*   Hgb A1c No results for input(s): HGBA1C in the last 72 hours. Lipid Profile No results for input(s): CHOL, HDL, LDLCALC, TRIG, CHOLHDL, LDLDIRECT in the last 72 hours. Thyroid function studies Recent Labs    09/02/20 0158  TSH 2.365   Anemia work up No results for input(s): VITAMINB12, FOLATE, FERRITIN, TIBC, IRON, RETICCTPCT in the last 72 hours. Urinalysis    Component Value Date/Time   COLORURINE STRAW (A) 09/15/2018 1455   APPEARANCEUR CLEAR 09/15/2018 1455   LABSPEC 1.006 09/15/2018 1455   PHURINE 8.0 09/15/2018 1455   GLUCOSEU NEGATIVE 09/15/2018 1455   HGBUR NEGATIVE 09/15/2018 1455   BILIRUBINUR NEGATIVE 09/15/2018 1455   KETONESUR NEGATIVE 09/15/2018 1455   PROTEINUR NEGATIVE 09/15/2018 1455   NITRITE NEGATIVE 09/15/2018 1455   LEUKOCYTESUR NEGATIVE 09/15/2018 1455   Sepsis Labs Invalid  input(s): PROCALCITONIN,  WBC,  LACTICIDVEN Microbiology Recent Results (from the past 240 hour(s))  Resp Panel by RT-PCR (Flu A&B, Covid) Nasopharyngeal Swab     Status: None   Collection Time: 09/01/20  3:22 PM   Specimen: Nasopharyngeal Swab; Nasopharyngeal(NP) swabs in vial transport medium  Result Value Ref Range Status   SARS Coronavirus 2 by RT PCR NEGATIVE NEGATIVE Final    Comment: (NOTE) SARS-CoV-2 target nucleic acids are NOT DETECTED.  The SARS-CoV-2 RNA is generally detectable in upper respiratory specimens during the acute phase of infection. The lowest concentration of SARS-CoV-2 viral copies this assay can detect is 138 copies/mL. A negative result does not preclude SARS-Cov-2 infection and should not be used as the sole basis for treatment or other patient management decisions. A negative result may occur  with  improper specimen collection/handling, submission of specimen other than nasopharyngeal swab, presence of viral mutation(s) within the areas targeted by this assay, and inadequate number of viral copies(<138 copies/mL). A negative result must be combined with clinical observations, patient history, and epidemiological information. The expected result is Negative.  Fact Sheet for Patients:  BloggerCourse.com  Fact Sheet for Healthcare Providers:  SeriousBroker.it  This test is no t yet approved or cleared by the [place] FDA and  has been authorized for detection and/or diagnosis of SARS-CoV-2 by FDA under an Emergency Use Authorization (EUA). This EUA will remain  in effect (meaning this test can be used) for the duration of the COVID-19 declaration under Section 564(b)(1) of the Act, 21 U.S.C.section 360bbb-3(b)(1), unless the authorization is terminated  or revoked sooner.       Influenza A by PCR NEGATIVE NEGATIVE Final   Influenza B by PCR NEGATIVE NEGATIVE Final    Comment: (NOTE) The Xpert Xpress SARS-CoV-2/FLU/RSV plus assay is intended as an aid in the diagnosis of influenza from Nasopharyngeal swab specimens and should not be used as a sole basis for treatment. Nasal washings and aspirates are unacceptable for Xpert Xpress SARS-CoV-2/FLU/RSV testing.  Fact Sheet for Patients: BloggerCourse.com  Fact Sheet for Healthcare Providers: SeriousBroker.it  This test is not yet approved or cleared by the [place] FDA and has been authorized for detection and/or diagnosis of SARS-CoV-2 by FDA under an Emergency Use Authorization (EUA). This EUA will remain in effect (meaning this test can be used) for the duration of the COVID-19 declaration under Section 564(b)(1) of the Act, 21 U.S.C. section 360bbb-3(b)(1), unless the authorization is terminated  or revoked.  Performed at [facility], [address], [place], [place] [address]    Time spent: 30 min  SIGNED:   Rickey Barbara, MD  [facility] 09/02/2020, 2:44 PM  If 7PM-7AM, please contact night-coverage

## 2020-09-02 NOTE — TOC Initial Note (Signed)
Transition of Care [facility]) - Initial/Assessment Note    Patient Details  Name: Wyatt King MRN: [id number] Date of Birth: 02/16/1936  Transition of [facility]) CM/SW Contact:    Cristobal Goldmann, LCSW Phone Number: 09/02/2020, 2:29 PM  Clinical Narrative: Patient is from [facility] and CSW advised that he is ready for discharge by nurse. Call made to patient's daughter Rayland Hamed - [phone] regarding discharge. She confirmed that patient is from [facility], has been there since April 2020 and will return there at discharge.  Talked with admissions staff person Darel Hong regarding patient's readiness for discharge and she is the admissions contact person today.                Expected Discharge Plan: Long Term Nursing Home [facility]) Barriers to Discharge: Other (comment) (Attempting to reach staff at faciity regarding patient's discharge)   Patient Goals and CMS Choice   CMS Medicare.gov Compare Post Acute Care list provided to:: Other (Comment Required) (Not needed as patient returning to [facility]) Choice offered to / list presented to : NA (Per daughter, patient returning to [facility])  Expected Discharge Plan and Services Expected Discharge Plan: Long Term Nursing Home [facility]) In-house Referral: Clinical Social Work     Living arrangements for the past 2 months: Skilled Nursing Facility ([facility] - [facility])                                     Prior Living Arrangements/Services Living arrangements for the past 2 months: Skilled Nursing Facility ([facility] - [facility]) Lives with:: Facility Resident ([facility]) Patient language and need for interpreter reviewed:: Yes Do you feel safe going back to the place where you live?: Yes      Need for Family Participation in Patient Care: Yes (Comment) Care giver support system in place?: Yes (comment)   Criminal Activity/Legal Involvement Pertinent to Current Situation/Hospitalization: No -  Comment as needed  Activities of Daily Living      Permission Sought/Granted Permission sought to share information with : Other (comment) (Patient oriented to self only. Contacted daughter regarding discharge.)                Emotional Assessment Appearance:: Appears older than stated age Attitude/Demeanor/Rapport: Unable to Assess (Patient was asleep) Affect (typically observed): Unable to Assess (Patient asleep) Orientation: : Oriented to Self Alcohol / Substance Use: Tobacco Use,Alcohol Use,Illicit Drugs (Per H&P patient has never smoked and he does not drink or use illicit drugs) Psych Involvement: No (comment)  Admission diagnosis:  Syncope [R55] Fall from slip, trip, or stumble, initial encounter [W01.0XXA] Dementia without behavioral disturbance, unspecified dementia type (HCC) [F03.90] Patient Active Problem List   Diagnosis Date Noted  . Syncope 09/01/2020  . Fall 09/01/2020  . Bradycardia 09/01/2020  . Dementia (HCC) 09/01/2020  . Essential hypertension 09/01/2020  . Memory disorder 07/07/2016  . Vitamin B12 deficiency 07/07/2016   PCP:  Darrow Bussing, MD Pharmacy:   [facility] [phone] [address], [place] - [phone] AT [facility] [address] [place] [address] Phone: [phone] Fax: [phone]     Social Determinants of Health (SDOH) Interventions  No SDOH interventions requested or needed at discharge   Readmission Risk Interventions No flowsheet data found.

## 2020-09-02 NOTE — Progress Notes (Signed)
  Echocardiogram 2D Echocardiogram with definity has been performed.  Leta Jungling M 09/02/2020, 8:28 AM

## 2020-09-02 NOTE — Progress Notes (Signed)
Carotid US completed    Please see CV Proc for preliminary results.   Geoge Lawrance, RVT  

## 2020-09-02 NOTE — Progress Notes (Signed)
Unable to complete admission due to patient's advanced dementia

## 2020-09-02 NOTE — TOC Transition Note (Addendum)
Transition of Care (TOC) - CM/SW Discharge Note *Discharged back to [facility] - LTC  Phone Number: [phone] (ask for patient's nurse)   Patient Details  Name: Wyatt King MRN: [id number] Date of Birth: [birth year]  Transition of Care [facility]) CM/SW Contact:  Wyatt Goldmann, LCSW Phone Number: 09/02/2020, 3:51 PM   Clinical Narrative: Patient medically stable for discharge and returning to [facility], where he is a LTC resident. Discharge clinicals transmitted to facility, nurse provided with information to call report and daughter contacted regarding patient's readiness for discharge today.     Final next level of care: Long Term Nursing Home [facility]) Barriers to Discharge: Barriers Resolved   Patient Goals and CMS Choice Patient states their goals for this hospitalization and ongoing recovery are:: Daughter agreeable for patient to return to [facility].gov Compare Post Acute Care list provided to:: Other (Comment Required) (Not needed as patient returning to [facility]) Choice offered to / list presented to : NA  Discharge Placement              Patient chooses bed at: [facility] (Patient LTC resident at [facility]) Patient to be transferred to facility by: Non-emergency ambulance transport Name of family member notified: Wyatt King - [phone] Patient and family notified of of transfer: 09/02/20  Discharge Plan and Services In-house Referral: Clinical Social Work                                   Social Determinants of Health (SDOH) Interventions  No SDOH interventions requested or needed at discharge   Readmission Risk Interventions No flowsheet data found.

## 2020-09-02 NOTE — [place] FL2 (Signed)
  [place] MEDICAID [id number] LEVEL OF CARE SCREENING TOOL     IDENTIFICATION  Patient Name: Wyatt King Birthdate: 1935-11-11 Sex: male Admission Date (Current Location): 09/01/2020  [place] and [place] Number:  [facility]nes Bast [id number] Facility and Address:  [facility] [place], [address] [place], [place] [address]      Provider Number: [id number]  Attending Physician Name and Address:  Jerald Kief, MD  Relative Name and Phone Number:  Shaquel Chavous (daughter) [phone]    Current Level of Care: Hospital Recommended Level of Care: Nursing Facility [facility] - LTC resident) Prior Approval Number:    Date Approved/Denied:   PASRR Number:    Discharge Plan: SNF [facility])    Current Diagnoses: Patient Active Problem List   Diagnosis Date Noted  . Syncope 09/01/2020  . Fall 09/01/2020  . Bradycardia 09/01/2020  . Dementia (HCC) 09/01/2020  . Essential hypertension 09/01/2020  . Memory disorder 07/07/2016  . Vitamin B12 deficiency 07/07/2016    Orientation RESPIRATION BLADDER Height & Weight        Normal Continent Weight: 187 lb 9.6 oz (85.1 kg) Height:  5\' 2"  (157.5 cm)  BEHAVIORAL SYMPTOMS/MOOD NEUROLOGICAL BOWEL NUTRITION STATUS      Continent Diet (Regular)  AMBULATORY STATUS COMMUNICATION OF NEEDS Skin   Limited Assist (Min Guard per PT) Verbally Normal                       Personal Care Assistance Level of Assistance  Bathing,Feeding,Dressing Bathing Assistance: Limited assistance Feeding assistance: Limited assistance Dressing Assistance: Limited assistance     Functional Limitations Info  Sight,Hearing,Speech Sight Info: Adequate Hearing Info: Adequate Speech Info: Adequate    SPECIAL CARE FACTORS FREQUENCY  PT (By licensed PT)     PT Frequency: PT evaluation 12/30              Contractures Contractures Info: Not present    Additional Factors Info  Code Status,Allergies Code Status Info:  Full Allergies Info: No known allergies           Current Medications (09/02/2020):  This is the current hospital active medication list Current Facility-Administered Medications  Medication Dose Route Frequency Provider Last Rate Last Admin  . acetaminophen (TYLENOL) tablet 650 mg  650 mg Oral Q6H PRN 09/04/2020, MD       Or  . acetaminophen (TYLENOL) suppository 650 mg  650 mg Rectal Q6H PRN John Giovanni, MD      . donepezil (ARICEPT) tablet 10 mg  10 mg Oral QHS John Giovanni, MD   10 mg at 09/02/20 0226  . enoxaparin (LOVENOX) injection 40 mg  40 mg Subcutaneous Q24H 09/04/20, MD      . fluticasone (FLONASE) 50 MCG/ACT nasal spray 1 spray  1 spray Each Nare Daily John Giovanni, MD   1 spray at 09/02/20 1010  . haloperidol lactate (HALDOL) injection 2 mg  2 mg Intravenous Q6H PRN 09/04/20, MD   2 mg at 09/02/20 1205  . vitamin B-12 (CYANOCOBALAMIN) tablet 1,000 mcg  1,000 mcg Oral Daily 09/04/20, MD   1,000 mcg at 09/02/20 1007     Discharge Medications: Please see discharge summary for a list of discharge medications.  Relevant Imaging Results:  Relevant Lab Results:   Additional Information ss#[id number]  04-15-1981, LCSW

## 2021-06-24 ENCOUNTER — Emergency Department (HOSPITAL_COMMUNITY)
Admission: EM | Admit: 2021-06-24 | Discharge: 2021-06-25 | Disposition: A | Discharge status: Skilled Nursing Facility | Payer: Medicare Other | Attending: Emergency Medicine | Admitting: Emergency Medicine

## 2021-06-24 ENCOUNTER — Emergency Department (HOSPITAL_COMMUNITY): Payer: Medicare Other

## 2021-06-24 ENCOUNTER — Other Ambulatory Visit: Payer: Self-pay

## 2021-06-24 ENCOUNTER — Encounter (HOSPITAL_COMMUNITY): Payer: Self-pay

## 2021-06-24 DIAGNOSIS — I10 Essential (primary) hypertension: Secondary | ICD-10-CM | POA: Diagnosis not present

## 2021-06-24 DIAGNOSIS — R079 Chest pain, unspecified: Secondary | ICD-10-CM | POA: Diagnosis not present

## 2021-06-24 DIAGNOSIS — W19XXXA Unspecified fall, initial encounter: Secondary | ICD-10-CM | POA: Insufficient documentation

## 2021-06-24 DIAGNOSIS — F0390 Unspecified dementia without behavioral disturbance: Secondary | ICD-10-CM | POA: Diagnosis not present

## 2021-06-24 DIAGNOSIS — R464 Slowness and poor responsiveness: Secondary | ICD-10-CM | POA: Insufficient documentation

## 2021-06-24 DIAGNOSIS — R109 Unspecified abdominal pain: Secondary | ICD-10-CM | POA: Diagnosis not present

## 2021-06-24 DIAGNOSIS — R55 Syncope and collapse: Secondary | ICD-10-CM | POA: Insufficient documentation

## 2021-06-24 DIAGNOSIS — Z79899 Other long term (current) drug therapy: Secondary | ICD-10-CM | POA: Insufficient documentation

## 2021-06-24 LAB — COMPREHENSIVE METABOLIC PANEL
ALT: 21 U/L (ref 0–44)
AST: 30 U/L (ref 15–41)
Albumin: 3.8 g/dL (ref 3.5–5.0)
Alkaline Phosphatase: 77 U/L (ref 38–126)
Anion gap: 11 (ref 5–15)
BUN: 19 mg/dL (ref 8–23)
CO2: 24 mmol/L (ref 22–32)
Calcium: 8.7 mg/dL — ABNORMAL LOW (ref 8.9–10.3)
Chloride: 100 mmol/L (ref 98–111)
Creatinine, Ser: 1.07 mg/dL (ref 0.61–1.24)
GFR, Estimated: >60 mL/min (ref >60)
Glucose, Bld: 127 mg/dL — ABNORMAL HIGH (ref 70–99)
Potassium: 3.9 mmol/L (ref 3.5–5.1)
Sodium: 135 mmol/L (ref 135–145)
Total Bilirubin: 0.9 mg/dL (ref 0.3–1.2)
Total Protein: 7.4 g/dL (ref 6.5–8.1)

## 2021-06-24 LAB — CBC WITH DIFFERENTIAL/PLATELET
Abs Immature Granulocytes: 0.03 10*3/uL (ref 0.00–0.07)
Basophils Absolute: 0 10*3/uL (ref 0.0–0.1)
Basophils Relative: 1 %
Eosinophils Absolute: 0.3 10*3/uL (ref 0.0–0.5)
Eosinophils Relative: 4 %
HCT: 35.7 % — ABNORMAL LOW (ref 39.0–52.0)
Hemoglobin: 11.4 g/dL — ABNORMAL LOW (ref 13.0–17.0)
Immature Granulocytes: 0 %
Lymphocytes Relative: 26 %
Lymphs Abs: 1.8 10*3/uL (ref 0.7–4.0)
MCH: 30 pg (ref 26.0–34.0)
MCHC: 31.9 g/dL (ref 30.0–36.0)
MCV: 93.9 fL (ref 80.0–100.0)
Monocytes Absolute: 0.7 10*3/uL (ref 0.1–1.0)
Monocytes Relative: 10 %
Neutro Abs: 4.2 10*3/uL (ref 1.7–7.7)
Neutrophils Relative %: 59 %
Platelets: 190 10*3/uL (ref 150–400)
RBC: 3.8 MIL/uL — ABNORMAL LOW (ref 4.22–5.81)
RDW: 13 % (ref 11.5–15.5)
WBC: 7.1 10*3/uL (ref 4.0–10.5)
nRBC: 0 % (ref 0.0–0.2)

## 2021-06-24 LAB — TROPONIN I (HIGH SENSITIVITY)
Troponin I (High Sensitivity): 5 ng/L (ref <18)
Troponin I (High Sensitivity): 7 ng/L (ref <18)

## 2021-06-24 LAB — LACTIC ACID, PLASMA: Lactic Acid, Venous: 1.7 mmol/L (ref 0.5–1.9)

## 2021-06-24 LAB — LIPASE, BLOOD: Lipase: 32 U/L (ref 11–51)

## 2021-06-24 MED ORDER — ONDANSETRON HCL 4 MG/2ML IJ SOLN
4.0000 mg | Freq: Once | INTRAMUSCULAR | Status: AC
  Administered 2021-06-24: 4 mg via INTRAVENOUS
  Filled 2021-06-24: qty 2

## 2021-06-24 NOTE — ED Notes (Signed)
Called [facility] spoke with Rubye Oaks RN and gave report at this time

## 2021-06-24 NOTE — ED Notes (Signed)
Pt found out of bed walking in the hallway. Pt had removed his PIV. And urinated on the floor.

## 2021-06-24 NOTE — ED Notes (Signed)
Received verbal report from Sarah W RN at this time 

## 2021-06-24 NOTE — ED Provider Notes (Signed)
[facility] EMERGENCY DEPARTMENT Provider Note   CSN: [id number] Arrival date & time: 06/24/21  1538     History Chief Complaint  Patient presents with   Marletta Lor    Wyatt King is a 85 y.o. male.  HPI      Had a seizure, was foaming at the mouth, syncope Looked like he was collapsed on the floor, whole body on floor, head leaning on the crack of the door, he looked like he was unconscious, unresponsive.  Started waking up, said he was having abdominal pain and chest pain, and while they were trying to put him on the stretcher he was throwing up.  Vomited with EMS. No seizure history. Face was twisting mildly bilaterally, not responding. Lasted about 2 minutes. No complaining of chest pain, abdominal pain prior to this.    Past Medical History:  Diagnosis Date   Cataract    Essential hypertension    Memory disorder 07/07/2016   Vitamin B12 deficiency 07/07/2016    Patient Active Problem List   Diagnosis Date Noted   Syncope 09/01/2020   Fall 09/01/2020   Bradycardia 09/01/2020   Dementia (HCC) 09/01/2020   Essential hypertension 09/01/2020   Memory disorder 07/07/2016   Vitamin B12 deficiency 07/07/2016    Past Surgical History:  Procedure Laterality Date   COLOSTOMY         Family History  Problem Relation Age of Onset   Stroke Father     Social History   Tobacco Use   Smoking status: Never   Smokeless tobacco: Never  Vaping Use   Vaping Use: Never used  Substance Use Topics   Alcohol use: No   Drug use: No    Home Medications Prior to Admission medications   Medication Sig Start Date End Date Taking? Authorizing Provider  donepezil (ARICEPT) 10 MG tablet Take 10 mg by mouth at bedtime. 12/02/17  Yes [provider]  ferrous sulfate 325 (65 FE) MG tablet Take 325 mg by mouth daily with breakfast.   Yes [provider]  lisinopril (ZESTRIL) 2.5 MG tablet Take 2.5 mg by mouth daily.   Yes [provider]  vitamin B-12 (CYANOCOBALAMIN) 1000 MCG tablet Take 1,000 mcg by mouth daily.   Yes [provider]    Allergies    Patient has no known allergies.  Review of Systems   Review of Systems  Unable to perform ROS: Dementia  Constitutional:  Negative for fever.  HENT:  Negative for sore throat.   Eyes:  Negative for visual disturbance.  Respiratory:  Negative for shortness of breath.   Cardiovascular:  Negative for chest pain (had reported for EMS but denies on my history).  Gastrointestinal:  Positive for nausea and vomiting (with EMS). Negative for abdominal pain.  Genitourinary:  Negative for difficulty urinating and dysuria.  Musculoskeletal:  Negative for back pain and neck stiffness.  Skin:  Negative for rash.  Neurological:  Positive for syncope. Negative for facial asymmetry, speech difficulty, weakness and headaches. Seizures: face twisting but not in rhythmic  way per nurse.  Physical Exam Updated Vital Signs BP 124/88   Pulse 65   Temp 97.7 F (36.5 C) (Oral)   Resp 17   SpO2 100%   Physical Exam Vitals and nursing note reviewed.  Constitutional:      General: He is not in acute distress.    Appearance: He is well-developed. He is not diaphoretic.  HENT:     Head: Normocephalic and  atraumatic.  Eyes:     Conjunctiva/sclera: Conjunctivae normal.  Cardiovascular:     Rate and Rhythm: Normal rate and regular rhythm.     Heart sounds: Normal heart sounds. No murmur heard.   No friction rub. No gallop.  Pulmonary:     Effort: Pulmonary effort is normal. No respiratory distress.     Breath sounds: Normal breath sounds. No wheezing or rales.  Abdominal:     General: There is no distension.     Palpations: Abdomen is soft.     Tenderness: There is no abdominal tenderness. There is no guarding.     Comments: Ostomy in place with stool in bag No tenderness, no distention  Musculoskeletal:     Cervical back: Normal range of motion.  Skin:    General:  Skin is warm and dry.  Neurological:     Mental Status: He is alert.     Comments: Oriented to self Normal strength bilateral upper and lower extremities Normal sensation, EOM, symmetric face    ED Results / Procedures / Treatments   Labs (all labs ordered are listed, but only abnormal results are displayed) Labs Reviewed  CBC WITH DIFFERENTIAL/PLATELET - Abnormal; Notable for the following components:      Result Value   RBC 3.80 (*)    Hemoglobin 11.4 (*)    HCT 35.7 (*)    All other components within normal limits  COMPREHENSIVE METABOLIC PANEL - Abnormal; Notable for the following components:   Glucose, Bld 127 (*)    Calcium 8.7 (*)    All other components within normal limits  LACTIC ACID, PLASMA  LIPASE, BLOOD  TROPONIN I (HIGH SENSITIVITY)  TROPONIN I (HIGH SENSITIVITY)    EKG EKG Interpretation  Date/Time:  Friday June 24 2021 15:38:51 EDT Ventricular Rate:  60 PR Interval:  250 QRS Duration: 104 QT Interval:  433 QTC Calculation: 433 R Axis:   59 Text Interpretation: Sinus rhythm Prolonged PR interval Abnormal R-wave progression, early transition Nonspecific T abnrm, anterolateral leads Minimal ST elevation, inferior leads New TW inversions anterior leads Confirmed by Alvira Monday ([id number]) on 06/24/2021 3:57:51 PM  Radiology CT HEAD WO CONTRAST ( )  Result Date: 06/24/2021 CLINICAL DATA:  Altered mental status, status post fall. EXAM: CT HEAD WITHOUT CONTRAST TECHNIQUE: Contiguous axial images were obtained from the base of the skull through the vertex without intravenous contrast. COMPARISON:  September 01, 2020 FINDINGS: Brain: There is mild cerebral atrophy with widening of the extra-axial spaces and ventricular dilatation. There are areas of decreased attenuation within the white matter tracts of the supratentorial brain, consistent with microvascular disease changes. Vascular: No hyperdense vessel or unexpected calcification. Skull: Normal. Negative  for fracture or focal lesion. Sinuses/Orbits: There is moderate severity bilateral ethmoid sinus mucosal thickening. Other: None. IMPRESSION: 1. Generalized cerebral atrophy. 2. No acute intracranial abnormality. 3. Moderate severity bilateral ethmoid sinus disease. Electronically Signed   By: Aram Candela M.D.   On: 06/24/2021 19:57   DG Chest Portable 1 View  Result Date: 06/24/2021 CLINICAL DATA:  Unwitnessed fall in nursing facility. EXAM: PORTABLE CHEST 1 VIEW COMPARISON:  09/01/2020 FINDINGS: Heart size is normal. Chronic aortic atherosclerotic calcification. The lungs are clear. The pulmonary vascularity is normal. No effusions. No abnormal bone finding. IMPRESSION: No active disease.  Aortic atherosclerosis. Electronically Signed   By: Paulina Fusi M.D.   On: 06/24/2021 16:56    Procedures Procedures   Medications Ordered in ED Medications  ondansetron (ZOFRAN) injection 4 mg (4  mg Intravenous Given 06/24/21 1655)    ED Course  I have reviewed the triage vital signs and the nursing notes.  Pertinent labs & imaging results that were available during my care of the patient were reviewed by me and considered in my medical decision making (see chart for details).    MDM Rules/Calculators/A&P                           85yo male with history of dementia, hypertension, prior admission for syncope/bradycardia 2021 who presents with concern for fall with loss of consciousness.   EKG shows new TW inversions in comparison to prior, however troponin negative x2.  Discussed with daughter and facility that we could admit him for syncope/cardiac work up, however with him not having consistent history of chest pain and with dementia history, previous admission, symptom free in ED with normal troponins I feel outpatient work up reasonable and they are in agreement.  He has stable hgb, no significant electrolyte abnormalities, normal lactic acid, normal vital signs, no signs of sepsis or  cardiac arrhythmia in the ED.  CXR without acute findings.  Unclear if he was fall then syncope or syncope then fall given it was unwitnessed. Nurse not clearly describing seizure like activity (bilateral face twisting while unconscious) however this remains possibility. CT head completed showing no evidence of bleed or other abnormalities. Neurologic exam nonfocal and do not suspect CVA.  Was vomiting PTA, but abdominal exam WNL, no continued vomiting in the ED. No signs of other injuries.  Patient discharged in stable condition with understanding of reasons to return.    Final Clinical Impression(s) / ED Diagnoses Final diagnoses:  Fall, initial encounter  Syncope, unspecified syncope type    Rx / DC Orders ED Discharge Orders     None        Alvira Monday, MD 06/25/21 1306

## 2021-06-24 NOTE — ED Notes (Signed)
Idriss Quackenbush daughter [phone]. Would like to be called with update

## 2021-06-24 NOTE — ED Triage Notes (Signed)
Pt from [facility] BIB EMS for c/o unwitnessed fall in the dining hall at the facility. Pt reported to EMS that he had CP that caused him to fall, however the pt later denied having any CP. Hx of dementia. Pt at baseline per facility staff. Pt had some episodes of vomiting PTA. 4mg  Zofran IV administered in route. C/o back pain.  20 RAC 120/60 HR 60 98% RA CBG 148 RR 16

## 2021-06-25 NOTE — ED Notes (Signed)
Attempted to call daughter about pt being d/c no answer

## 2021-06-29 ENCOUNTER — Ambulatory Visit: Payer: Medicare Other | Admitting: Cardiology

## 2021-08-04 ENCOUNTER — Encounter: Payer: Self-pay | Admitting: Cardiology

## 2021-08-04 ENCOUNTER — Other Ambulatory Visit: Payer: Self-pay

## 2021-08-04 ENCOUNTER — Ambulatory Visit (INDEPENDENT_AMBULATORY_CARE_PROVIDER_SITE_OTHER): Payer: Medicare Other | Admitting: Cardiology

## 2021-08-04 VITALS — BP 118/64 | Resp 20 | Ht 68.0 in | Wt 188.0 lb

## 2021-08-04 DIAGNOSIS — I10 Essential (primary) hypertension: Secondary | ICD-10-CM

## 2021-08-04 DIAGNOSIS — R001 Bradycardia, unspecified: Secondary | ICD-10-CM

## 2021-08-04 DIAGNOSIS — R55 Syncope and collapse: Secondary | ICD-10-CM | POA: Diagnosis not present

## 2021-08-04 NOTE — Progress Notes (Signed)
Primary Care Provider: Lujean Amel, [facility] Cardiologist: Glenetta Hew, MD Electrophysiologist: None  Clinic Note: Chief Complaint  Patient presents with   Hospitalization Follow-up    ER follow-up after presumable syncopal episode.    ===================================  ASSESSMENT/PLAN   Problem List Items Addressed This Visit       Cardiology Problems   Essential hypertension - Primary (Chronic)    He carries a diagnosis of hypertension.  He is on lisinopril.  Blood pressure was well controlled.        Other   Syncope    With him being a poor historian, and the episode happening a month and a half ago, and Multaq B also got what happened.  He has had an echocardiogram done just last year that was normal.  Nothing significant on exam.  Perhaps he could have had bradycardia since he does have a heart rate in the 50s here.  This could potentially explain syncope and seizure-like activity, tachycardia would not and I doubt he will be tachycardic given his advanced age.  Plan: 14-day Zio patch monitor.  I would not do any further testing on this elderly gentleman who is pleasantly demented and does not have any active symptoms at this point.  The event monitor will tell us if he potentially has bradycardiac, however he likely would not be a pacemaker candidate given his advanced age and dementia.  Talking to the caregiver, it does not seem like there has been any real family visitors.  Would not really make any decisions without significant testing without discussion with caregivers/family members.  It is unlikely that the episode was ischemic in nature given the fact that he did not have positive troponins.  There were EKG changes which suggested possible arrhythmogenic etiology.  I discussed ordering 14-day monitor with the caregiver.  This is until the staff placed a monitor on him.  Clearly they would not be able to be any patient diary.  Cytomel is  unremarkable I do not think he needs to have further cardiac evaluation.      Relevant Orders   EKG 12-Lead (Completed)   LONG TERM MONITOR (3-14 DAYS)   Bradycardia (Chronic)    Sinus bradycardia on current EKG.  It is possible that he had symptomatic bradycardia as etiology for his episode. Plan: 14-day Zio patch monitor.       ===================================  HPI:    Wyatt King is a 85 y.o. male with history of HTN and dementia as well as previously reported bradycardia who is being seen today for the evaluation of  SYNCOPE at the request of Koirala, Dibas, MD.  Recent Hospitalizations:  06/24/2021-[place], ER: Unresponsive episode, felt to be seizure with foaming at the mouth.  Collapse on floor unresponsive.  Noted abdominal pain and chest pain after waking up..  No complaints prior to this episode.  Troponins negative.  EKG had new T wave inversions.  Normal head CT.  Nonfocal neurologic exam CVA not suspected.  Ruzgar Sassaman was ostensibly referred after recent ER visit.  Referring physician is said to be Dr. Dorthy Cooler, but no note from PCP available.     Reviewed  CV studies:    The following studies were reviewed today: (if available, images/films reviewed: From Epic Chart or Care Everywhere) Echocardiogram 09/02/2020: EF 60 to 65%.  GR 1 DD.  No R WMA.  Normal RV.  Mild to moderate aortic sclerosis but no stenosis.  Normal RAP.  Normal MV Carotid Dopplers 09/02/2020:  <  40% bilateral ICA.  Normal subclavian arteries.  Unable to visualize right vertebral artery.  Left vertebral artery has blunted signal.   Interval History:   Wyatt King is an 85 year old gentleman with very significant dementia.  He lives in a nursing facility.  He is accompanied by a caregiver from the facility.  She indicates that there is minimal family around.  At baseline he is relatively active, and has no major physical complaints.  He himself is a very poor historian.  Not able to  communicate anything to me. He appears to be relatively pleasantly demented in no physical distress.  Unfortunately, no history can provided besides what was given to the ER physician.  Apparently is not having further episodes since that last episode.  No longer has any signs or symptoms to suggest he is short of breath.  Has not mentioned anything about chest discomfort since ER visit.  Has not had any other illnesses.  REVIEWED OF SYSTEMS   Unable to assess review of symptoms.  Patient cannot provide history.  Caregiver indicates that he has seemed to be relatively fine over the last month.  I have reviewed and (if needed) personally updated the patient's problem list, medications, allergies, past medical and surgical history, social and family history.   PAST MEDICAL HISTORY   Past Medical History:  Diagnosis Date   Cataract    Essential hypertension    Memory disorder 07/07/2016   Vitamin B12 deficiency 07/07/2016    PAST SURGICAL HISTORY   Past Surgical History:  Procedure Laterality Date   COLOSTOMY      There is no immunization history on file for this patient.  MEDICATIONS/ALLERGIES   Current Meds  Medication Sig   donepezil (ARICEPT) 10 MG tablet Take 10 mg by mouth at bedtime.   ferrous sulfate 325 (65 FE) MG tablet Take 325 mg by mouth daily with breakfast.   lisinopril (ZESTRIL) 2.5 MG tablet Take 2.5 mg by mouth daily.   polyethylene glycol (MIRALAX / GLYCOLAX) 17 g packet Take 17 g by mouth daily.   vitamin B-12 (CYANOCOBALAMIN) 1000 MCG tablet Take 1,000 mcg by mouth daily.    No Known Allergies  SOCIAL HISTORY/FAMILY HISTORY   Reviewed in Epic:   Social History   Tobacco Use   Smoking status: Never   Smokeless tobacco: Never  Vaping Use   Vaping Use: Never used  Substance Use Topics   Alcohol use: No   Drug use: No   Social History   Social History Narrative   Lives at home w/ his daughter and grandson   Right-handed   Caffeine: 2-3 cups  of coffee as well as tea/soda during the day   Family History  Problem Relation Age of Onset   Stroke Father     OBJCTIVE -PE, EKG, labs   Wt Readings from Last 3 Encounters:  08/04/21 188 lb (85.3 kg)  09/02/20 187 lb 9.6 oz (85.1 kg)  09/15/18 210 lb 1.6 oz (95.3 kg)    Physical Exam: BP 118/64 (BP Location: Left Arm, Patient Position: Sitting, Cuff Size: Normal)   Resp 20   Ht 5\' 8"  (1.727 m)   Wt 188 lb (85.3 kg)   SpO2 97%   BMI 28.59 kg/m  Physical Exam Vitals reviewed.  Constitutional:      General: He is not in acute distress.    Appearance: Normal appearance. He is normal weight.     Comments: Well-groomed.  HENT:     Head: Normocephalic and  atraumatic.  Neck:     Vascular: No carotid bruit.  Cardiovascular:     Rate and Rhythm: Regular rhythm. Bradycardia present.     Pulses: Normal pulses.     Heart sounds: Murmur (1-2/6 SEM at RUSB) heard.    No friction rub. No gallop.  Pulmonary:     Effort: Pulmonary effort is normal. No respiratory distress.     Breath sounds: Normal breath sounds.  Chest:     Chest wall: No tenderness.  Musculoskeletal:        General: Swelling (Trivial to 1+ bilateral LE edema.) present.     Cervical back: Normal range of motion and neck supple.  Skin:    General: Skin is warm and dry.  Neurological:     Gait: Gait (Was brought in a wheelchair.  He apparently is able to walk some) normal.     Comments: Awake and alert, but not oriented to person place or time.  Psychiatric:     Comments: Does not respond appropriately to questions.     Adult ECG Report  Rate: 53;  Rhythm: sinus bradycardia and nonspecific ST and T wave changes with flattened ST segments.  T wave versions not noted. ;   Narrative Interpretation: Relatively stable with exception of the fact that the T waves in anterior leads are not noted.  Recent Labs: Labs reviewed. No results found for: CHOL, HDL, LDLCALC, LDLDIRECT, TRIG, CHOLHDL Lab Results  Component  Value Date   CREATININE 1.07 06/24/2021   BUN 19 06/24/2021   NA 135 06/24/2021   K 3.9 06/24/2021   CL 100 06/24/2021   CO2 24 06/24/2021   CBC Latest Ref Rng & Units 06/24/2021 09/01/2020 09/15/2018  WBC 4.0 - 10.5 K/uL 7.1 9.7 5.4  Hemoglobin 13.0 - 17.0 g/dL 11.4(L) 11.1(L) 11.4(L)  Hematocrit 39.0 - 52.0 % 35.7(L) 34.9(L) 36.5(L)  Platelets 150 - 400 K/uL 190 221 187    No results found for: HGBA1C Lab Results  Component Value Date   TSH 2.365 09/02/2020    ==================================================  COVID-19 Education: The signs and symptoms of COVID-19 were discussed with the patient and how to seek care for testing (follow up with PCP or arrange E-visit).    I spent a total of 27 minutes with the patient spent in direct patient consultation.  Somewhat difficult visit.  I had to try to figure out what the history was what symptoms were.  This would not be able to be provided by the patient and the caregiver herself was not very forthcoming.  We tried to get some reports but there were no other data besides what was provided by the hospital ER visit. Additional time spent with chart review  / charting (studies, outside notes, etc): 23 min reviewed echo results and Dopplers as noted above. Total Time: 50 min  Current medicines are reviewed at length with the patient today.  (+/- concerns) N/A  This visit occurred during the SARS-CoV-2 public health emergency.  Safety protocols were in place, including screening questions prior to the visit, additional usage of staff PPE, and extensive cleaning of exam room while observing appropriate contact time as indicated for disinfecting solutions.  Notice: This dictation was prepared with Dragon dictation along with smart phrase technology. Any transcriptional errors that result from this process are unintentional and may not be corrected upon review.   Studies Ordered:  Orders Placed This Encounter  Procedures   LONG TERM  MONITOR (3-14 DAYS)   EKG 12-Lead  Patient Instructions / Medication Changes & Studies & Tests Ordered   Patient Instructions  Medication Instructions:   No changes  *If you need a refill on your cardiac medications before your next appointment, please call your pharmacy*   Lab Work: Not needed   Testing/Procedures:  Will be mailed if  attending at faciltity would like to proceed with monitor- Someone at the facility will have place the monitor on patient and send it back the [place] has recommended that you wear a holter monitor 14 days . Holter monitors are medical devices that record the heart's electrical activity. Doctors most often use these monitors to diagnose arrhythmias. Arrhythmias are problems with the speed or rhythm of the heartbeat. The monitor is a small, portable device. You can wear one while you do your normal daily activities. This is usually used to diagnose what is causing palpitations/syncope (passing out).    Follow-Up: At [facility], you and your health needs are our priority.  As part of our continuing mission to provide you with exceptional heart care, we have created designated Provider Care Teams.  These Care Teams include your primary Cardiologist (physician) and Advanced Practice Providers (APPs -  Physician Assistants and Nurse Practitioners) who all work together to provide you with the care you need, when you need it.  We recommend signing up for the patient portal called "MyChart".  Sign up information is provided on this After Visit Summary.  MyChart is used to connect with patients for Virtual Visits (Telemedicine).  Patients are able to view lab/test results, encounter notes, upcoming appointments, etc.  Non-urgent messages can be sent to your provider as well.   To learn more about what you can do with MyChart, go to [email or web address].    Your next appointment:   3 month(s) ( if monitor is completed)   The format for  your next appointment:   In Person  Provider:   Glenetta Hew, MD     Other Instructions Bryn Gulling- Long Term Monitor Instructions    Glenetta Hew, M.D., M.S. Interventional Cardiologist   Pager # [phone] Phone # [phone] [address] [place], [place] [address]   Thank you for choosing Heartcare at [facility]!!

## 2021-08-04 NOTE — Patient Instructions (Signed)
Medication Instructions:   No changes  *If you need a refill on your cardiac medications before your next appointment, please call your pharmacy*   Lab Work: Not needed   Testing/Procedures:  Will be mailed if  attending at faciltity would like to proceed with monitor- Someone at the facility will have place the monitor on patient and send it back the company Your physician has recommended that you wear a holter monitor 14 days . Holter monitors are medical devices that record the heart's electrical activity. Doctors most often use these monitors to diagnose arrhythmias. Arrhythmias are problems with the speed or rhythm of the heartbeat. The monitor is a small, portable device. You can wear one while you do your normal daily activities. This is usually used to diagnose what is causing palpitations/syncope (passing out).    Follow-Up: At [facility], you and your health needs are our priority.  As part of our continuing mission to provide you with exceptional heart care, we have created designated Provider Care Teams.  These Care Teams include your primary Cardiologist (physician) and Advanced Practice Providers (APPs -  Physician Assistants and Nurse Practitioners) who all work together to provide you with the care you need, when you need it.  We recommend signing up for the patient portal called "MyChart".  Sign up information is provided on this After Visit Summary.  MyChart is used to connect with patients for Virtual Visits (Telemedicine).  Patients are able to view lab/test results, encounter notes, upcoming appointments, etc.  Non-urgent messages can be sent to your provider as well.   To learn more about what you can do with MyChart, go to [email or web address].    Your next appointment:   3 month(s) ( if monitor is completed)   The format for your next appointment:   In Person  Provider:   Bryan Lemma, MD     Other Instructions ZIO XT- Long Term Monitor  Instructions  Your physician has requested you wear a ZIO patch monitor for 14 days.  This is a single patch monitor. [employer] supplies one patch monitor per enrollment. Additional stickers are not available. Please do not apply patch if you will be having a Nuclear Stress Test,  Echocardiogram, Cardiac CT, MRI, or Chest Xray during the period you would be wearing the  monitor. The patch cannot be worn during these tests. You cannot remove and re-apply the  ZIO XT patch monitor.  Your ZIO patch monitor will be mailed 3 day USPS to your address on file. It may take 3-5 days  to receive your monitor after you have been enrolled.  Once you have received your monitor, please review the enclosed instructions. Your monitor  has already been registered assigning a specific monitor serial # to you.  Billing and Patient Assistance Program Information  We have supplied [employer] with any of your insurance information on file for billing purposes. [employer] offers a sliding scale Patient Assistance Program for patients that do not have  insurance, or whose insurance does not completely cover the cost of the ZIO monitor.  You must apply for the Patient Assistance Program to qualify for this discounted rate.  To apply, please call [employer] at [phone], select option 4, select option 2, ask to apply for  Patient Assistance Program. Meredeth Ide will ask your household income, and how many people  are in your household. They will quote your out-of-pocket cost based on that information.  [employer] will also be able to set up a 25-month, interest-free  payment plan if needed.  Applying the monitor   Shave hair from upper left chest.  Hold abrader disc by orange tab. Rub abrader in 40 strokes over the upper left chest as  indicated in your monitor instructions.  Clean area with 4 enclosed alcohol pads. Let dry.  Apply patch as indicated in monitor instructions. Patch will be placed under collarbone on left  side  of chest with arrow pointing upward.  Rub patch adhesive wings for 2 minutes. Remove white label marked "1". Remove the white  label marked "2". Rub patch adhesive wings for 2 additional minutes.  While looking in a mirror, press and release button in center of patch. A small green light will  flash 3-4 times. This will be your only indicator that the monitor has been turned on.  Do not shower for the first 24 hours. You may shower after the first 24 hours.  Press the button if you feel a symptom. You will hear a small click. Record Date, Time and  Symptom in the Patient Logbook.  When you are ready to remove the patch, follow instructions on the last 2 pages of Patient  Logbook. Stick patch monitor onto the last page of Patient Logbook.  Place Patient Logbook in the blue and white box. Use locking tab on box and tape box closed  securely. The blue and white box has prepaid postage on it. Please place it in the mailbox as  soon as possible. Your physician should have your test results approximately 7 days after the  monitor has been mailed back to [facility]  Call [facility] Customer Care at [phone] if you have questions regarding  your ZIO XT patch monitor. Call them immediately if you see an orange light blinking on your  monitor.  If your monitor falls off in less than 4 days, contact our Monitor department at [phone].  If your monitor becomes loose or falls off after 4 days call [employer] at [phone] for  suggestions on securing your monitor

## 2021-08-05 ENCOUNTER — Telehealth: Payer: Self-pay | Admitting: Cardiology

## 2021-08-05 NOTE — Telephone Encounter (Signed)
Faxed over report requested to [facility].

## 2021-08-05 NOTE — Telephone Encounter (Signed)
Eulis Foster from [facility] is calling regarding the office notes from Mr. Wyatt King New Pt visit yesterday. She would like for them to be faxed to her office [phone]

## 2021-08-17 ENCOUNTER — Encounter: Payer: Self-pay | Admitting: Cardiology

## 2021-08-17 NOTE — Assessment & Plan Note (Signed)
He carries a diagnosis of hypertension.  He is on lisinopril.  Blood pressure was well controlled.

## 2021-08-18 ENCOUNTER — Encounter: Payer: Self-pay | Admitting: Cardiology

## 2021-08-18 NOTE — Assessment & Plan Note (Signed)
With him being a poor historian, and the episode happening a month and a half ago, and Multaq B also got what happened.  He has had an echocardiogram done just last year that was normal.  Nothing significant on exam.  Perhaps he could have had bradycardia since he does have a heart rate in the 50s here.  This could potentially explain syncope and seizure-like activity, tachycardia would not and I doubt he will be tachycardic given his advanced age.  Plan: 14-day Zio patch monitor.  I would not do any further testing on this elderly gentleman who is pleasantly demented and does not have any active symptoms at this point.  The event monitor will tell us if he potentially has bradycardiac, however he likely would not be a pacemaker candidate given his advanced age and dementia.  Talking to the caregiver, it does not seem like there has been any real family visitors.  Would not really make any decisions without significant testing without discussion with caregivers/family members.  It is unlikely that the episode was ischemic in nature given the fact that he did not have positive troponins.  There were EKG changes which suggested possible arrhythmogenic etiology.  I discussed ordering 14-day monitor with the caregiver.  This is until the staff placed a monitor on him.  Clearly they would not be able to be any patient diary.  Cytomel is unremarkable I do not think he needs to have further cardiac evaluation.

## 2021-08-18 NOTE — Assessment & Plan Note (Signed)
Sinus bradycardia on current EKG.  It is possible that he had symptomatic bradycardia as etiology for his episode. Plan: 14-day Zio patch monitor.

## 2021-09-10 ENCOUNTER — Emergency Department (HOSPITAL_COMMUNITY)
Admission: EM | Admit: 2021-09-10 | Discharge: 2021-09-10 | Disposition: A | Discharge status: Skilled Nursing Facility | Payer: Medicare Other | Attending: Emergency Medicine | Admitting: Emergency Medicine

## 2021-09-10 ENCOUNTER — Other Ambulatory Visit: Payer: Self-pay

## 2021-09-10 ENCOUNTER — Encounter (HOSPITAL_COMMUNITY): Payer: Self-pay | Admitting: Oncology

## 2021-09-10 ENCOUNTER — Emergency Department (HOSPITAL_COMMUNITY): Payer: Medicare Other

## 2021-09-10 DIAGNOSIS — R55 Syncope and collapse: Secondary | ICD-10-CM | POA: Diagnosis not present

## 2021-09-10 DIAGNOSIS — I10 Essential (primary) hypertension: Secondary | ICD-10-CM | POA: Insufficient documentation

## 2021-09-10 DIAGNOSIS — I4581 Long QT syndrome: Secondary | ICD-10-CM | POA: Diagnosis not present

## 2021-09-10 DIAGNOSIS — Z20822 Contact with and (suspected) exposure to covid-19: Secondary | ICD-10-CM | POA: Insufficient documentation

## 2021-09-10 DIAGNOSIS — F0390 Unspecified dementia without behavioral disturbance: Secondary | ICD-10-CM | POA: Insufficient documentation

## 2021-09-10 DIAGNOSIS — R9431 Abnormal electrocardiogram [ECG] [EKG]: Secondary | ICD-10-CM

## 2021-09-10 DIAGNOSIS — Z79899 Other long term (current) drug therapy: Secondary | ICD-10-CM | POA: Insufficient documentation

## 2021-09-10 LAB — CBC WITH DIFFERENTIAL/PLATELET
Abs Immature Granulocytes: 0.02 10*3/uL (ref 0.00–0.07)
Basophils Absolute: 0 10*3/uL (ref 0.0–0.1)
Basophils Relative: 1 %
Eosinophils Absolute: 0.3 10*3/uL (ref 0.0–0.5)
Eosinophils Relative: 4 %
HCT: 38.5 % — ABNORMAL LOW (ref 39.0–52.0)
Hemoglobin: 12.6 g/dL — ABNORMAL LOW (ref 13.0–17.0)
Immature Granulocytes: 0 %
Lymphocytes Relative: 22 %
Lymphs Abs: 1.4 10*3/uL (ref 0.7–4.0)
MCH: 30.9 pg (ref 26.0–34.0)
MCHC: 32.7 g/dL (ref 30.0–36.0)
MCV: 94.4 fL (ref 80.0–100.0)
Monocytes Absolute: 0.8 10*3/uL (ref 0.1–1.0)
Monocytes Relative: 12 %
Neutro Abs: 4.1 10*3/uL (ref 1.7–7.7)
Neutrophils Relative %: 61 %
Platelets: 211 10*3/uL (ref 150–400)
RBC: 4.08 MIL/uL — ABNORMAL LOW (ref 4.22–5.81)
RDW: 12.6 % (ref 11.5–15.5)
WBC: 6.6 10*3/uL (ref 4.0–10.5)
nRBC: 0 % (ref 0.0–0.2)

## 2021-09-10 LAB — COMPREHENSIVE METABOLIC PANEL
ALT: 26 U/L (ref 0–44)
AST: 30 U/L (ref 15–41)
Albumin: 4 g/dL (ref 3.5–5.0)
Alkaline Phosphatase: 70 U/L (ref 38–126)
Anion gap: 7 (ref 5–15)
BUN: 18 mg/dL (ref 8–23)
CO2: 28 mmol/L (ref 22–32)
Calcium: 9 mg/dL (ref 8.9–10.3)
Chloride: 100 mmol/L (ref 98–111)
Creatinine, Ser: 0.9 mg/dL (ref 0.61–1.24)
GFR, Estimated: >60 mL/min (ref >60)
Glucose, Bld: 94 mg/dL (ref 70–99)
Potassium: 3.9 mmol/L (ref 3.5–5.1)
Sodium: 135 mmol/L (ref 135–145)
Total Bilirubin: 1.1 mg/dL (ref 0.3–1.2)
Total Protein: 8 g/dL (ref 6.5–8.1)

## 2021-09-10 LAB — RESP PANEL BY RT-PCR (FLU A&B, COVID) ARPGX2
Influenza A by PCR: NEGATIVE
Influenza B by PCR: NEGATIVE
SARS Coronavirus 2 by RT PCR: NEGATIVE

## 2021-09-10 LAB — TROPONIN I (HIGH SENSITIVITY): Troponin I (High Sensitivity): 7 ng/L (ref <18)

## 2021-09-10 MED ORDER — SODIUM CHLORIDE 0.9 % IV BOLUS
500.0000 mL | Freq: Once | INTRAVENOUS | Status: AC
  Administered 2021-09-10: 500 mL via INTRAVENOUS

## 2021-09-10 NOTE — ED Provider Notes (Addendum)
[facility] DEPT Provider Note   CSN: [id number] Arrival date & time: 09/10/21  [id number]     History  Chief Complaint  Patient presents with   Loss of Consciousness    Wyatt King is a 86 y.o. male with PMHx of dementia, bradycardia and HTN presented from [facility] with presumed syncopal episode. Pt is a poor historian. According to nursing staff at [facility], pt was slump in wheelchair and drooling. They suspect he may have had an syncopal episode or sleeping. Pt is oriented to self only. Denies any pain or complaints.   Multiple attempts to contact [facility] facility for collateral information; unsuccessful.    Loss of Consciousness     Home Medications Prior to Admission medications   Medication Sig Start Date End Date Taking? Authorizing Provider  donepezil (ARICEPT) 10 MG tablet Take 10 mg by mouth at bedtime. 12/02/17  Yes [provider]  lisinopril (ZESTRIL) 2.5 MG tablet Take 2.5 mg by mouth daily.   Yes [provider]  polyethylene glycol (MIRALAX / GLYCOLAX) 17 g packet Take 17 g by mouth daily.   Yes [provider]  vitamin B-12 (CYANOCOBALAMIN) 1000 MCG tablet Take 1,000 mcg by mouth daily.   Yes [provider]      Allergies    Patient has no known allergies.    Review of Systems   Review of Systems  Cardiovascular:  Positive for syncope.   Physical Exam Updated Vital Signs BP (!) 149/77 (BP Location: Left Arm)    Pulse 61    Temp 97.7 F (36.5 C) (Oral)    Resp 18    SpO2 100%  Physical Exam Constitutional:      General: He is not in acute distress. Cardiovascular:     Rate and Rhythm: Regular rhythm.     Heart sounds: Normal heart sounds.  Pulmonary:     Effort: Pulmonary effort is normal.     Breath sounds: Normal breath sounds and air entry.  Musculoskeletal:     Right lower leg: No edema.     Left lower leg: No edema.  Skin:    General: Skin is warm and dry.   Psychiatric:        Cognition and Memory: Cognition is impaired.     Comments: Unable to answer questions appropriately; Pt rambles off topic when asked direct questions.    ED Results / Procedures / Treatments   Labs (all labs ordered are listed, but only abnormal results are displayed) Labs Reviewed  URINE CULTURE  RESP PANEL BY RT-PCR (FLU A&B, COVID) ARPGX2  COMPREHENSIVE METABOLIC PANEL  CBC WITH DIFFERENTIAL/PLATELET  URINALYSIS, ROUTINE W REFLEX MICROSCOPIC  TROPONIN I (HIGH SENSITIVITY)    EKG EKG Interpretation  Date/Time:  Saturday September 10 2021 08:57:47 EST Ventricular Rate:  61 PR Interval:  240 QRS Duration: 90 QT Interval:  554 QTC Calculation: 559 R Axis:   60 Text Interpretation: Sinus rhythm Prolonged PR interval Borderline T abnormalities, anterior leads Prolonged QT interval Confirmed by Nanda Quinton [phone]) on 09/10/2021 9:10:53 AM  Radiology No results found.  Procedures Procedures    Medications Ordered in ED Medications  sodium chloride 0.9 % bolus 500 mL (has no administration in time range)    ED Course/ Medical Decision Making/ A&P                           Medical Decision Making  Possible syncopal episode  Pt appears to be in no acute distress and vitals are stable. EKG sinus rhythm with prolonged QT interval which was not present on comparison of prior EKG one month ago. Pt has cardiac hx significant for bradycardia, however pt was in sinus rhythm on admission. Echocardiogram obtained 1 year ago was normal. Although Pt is a poor historian, he denies CP or SOB. Lungs were clear on ascultation and heart sounds normal. According to [facility], pt is oriented to self at baseline. Pt appears to be at baseline and lying comfortably in bed. He is pleasantly demented and appears clinically stable. Will obtain basic labs, trops and CT head. Pt placed on continuous cardiac monitoring. Pt given 546mL IV fluids for hydration. CT head negative; Trops  were flat. CMP unremarkable. Med list reveals no obvious QT prolongation agents. Cardiac monitoring negative for any arrhythmias. Pt otherwise stable and well for discharge.         Final Clinical Impression(s) / ED Diagnoses Final diagnoses:  Syncope, unspecified syncope type  Prolonged Q-T interval on ECG    Rx / DC Orders ED Discharge Orders     None         Timothy Lasso, MD 09/10/21 [id number]    Timothy Lasso, MD 09/10/21 Prichard, Wonda Olds, MD 09/11/21 1750

## 2021-09-10 NOTE — ED Triage Notes (Signed)
Pt bib [facility] from [facility] d/t episode of syncope.  Per EMS staff at [facility] reports pt slumped over in wheelchair and began to drool.  Unclear if pt fell asleep or had true syncopal episode.  Pt is oriented to self only at baseline.  Pt has no c/o.

## 2021-10-12 NOTE — Progress Notes (Signed)
Cardiology Office Note:    Date:  10/13/2021   ID:  Wyatt King, DOB 1936/06/17, MRN [id number]  PCP:  Darrow BussingKoirala, Dibas, MD   [facility] Providers Cardiologist:  Bryan Lemmaavid Harding, MD     Referring MD: Darrow BussingKoirala, Dibas, MD   Chief Complaint: follow-up syncope  History of Present Illness:    Wyatt CockingJessie King is a 86 y.o. male with a hx of HTN, bradycardia, dementia, colostomy, and syncope.  He presented to the ED on 06/24/21 s/p unwitnessed fall with reported seizure, foaming at mouth, and questionable syncope. Was found on floor appearing to be unconscious, unresponsive. When he began coming to, he said that he had abdominal and chest pain and began to vomit. EKG revealed NSR with prolonged PR, abnormal R-wave progression, early transition, nonspecific T wave abnormality anterolateral leads, minimal ST elevation inferior leads. New TW inversions anterior leads when compared to previous tracing but Hs troponin negative x 2. Head CT was negative. He was d/c'ed back to his facility.  Following this event, he was referred to our group for evaluation of syncope and was seen by Dr. Herbie BaltimoreHarding on 08/04/21. The patient was unable to give a clear history and a caregiver accompanied him to the visit, but no family was present. Caregiver reported patient relatively active at baseline. Cardiac monitor that was ordered was not completed.   He again was transported to the ED with questionable syncope on 09/10/21. EKG revealed no changes, CT head negative, and no anemia, AKI or leukocytosis identified. He was transferred back to his facility.   Today, he is here with a caregiver.  He is verbal but does not follow commands or answer questions.  He is fidgeting with his pants and with napkins that he is holding. Physical exam is somewhat limited by patient's inability to follow commands.  Past Medical History:  Diagnosis Date   Cataract    Essential hypertension    Memory disorder 07/07/2016   Vitamin B12  deficiency 07/07/2016    Past Surgical History:  Procedure Laterality Date   COLOSTOMY      Current Medications: Current Meds  Medication Sig   donepezil (ARICEPT) 10 MG tablet Take 10 mg by mouth at bedtime.   ferrous sulfate 325 (65 FE) MG tablet Take 325 mg by mouth daily with breakfast.   lisinopril (ZESTRIL) 2.5 MG tablet Take 2.5 mg by mouth daily.   polyethylene glycol (MIRALAX / GLYCOLAX) 17 g packet Take 17 g by mouth daily.   vitamin B-12 (CYANOCOBALAMIN) 1000 MCG tablet Take 1,000 mcg by mouth daily.     Allergies:   Patient has no known allergies.   Social History   Socioeconomic History   Marital status: Widowed    Spouse name: Not on file   Number of children: 5   Years of education: Associates   Highest education level: Not on file  Occupational History   Occupation: N/A  Tobacco Use   Smoking status: Never   Smokeless tobacco: Never  Vaping Use   Vaping Use: Never used  Substance and Sexual Activity   Alcohol use: No   Drug use: No   Sexual activity: Not Currently  Other Topics Concern   Not on file  Social History Narrative   Lives at home w/ his daughter and grandson   Right-handed   Caffeine: 2-3 cups of coffee as well as tea/soda during the day   Social Determinants of Health   Financial Resource Strain: Not on file  Food Insecurity: Not on  file  Transportation Needs: Not on file  Physical Activity: Not on file  Stress: Not on file  Social Connections: Not on file     Family History: The patient's family history includes Stroke in his father.  ROS:   Please see the history of present illness.  All other systems reviewed and are negative.  Labs/Other Studies Reviewed:    The following studies were reviewed today:  Echo 09/02/20  Left Ventricle: Left ventricular ejection fraction, by estimation, is 60  to 65%. The left ventricle has normal function. The left ventricle has no  regional wall motion abnormalities. Definity contrast  agent was given IV  to delineate the left ventricular  endocardial borders. The left ventricular internal cavity size was normal in size. There is no left ventricular hypertrophy. Left ventricular diastolic parameters are consistent with Grade I diastolic dysfunction  (impaired relaxation).  Right Ventricle: The right ventricular size is normal. No increase in  right ventricular wall thickness. Right ventricular systolic function is  normal.  Left Atrium: Left atrial size was normal in size.  Right Atrium: Right atrial size was normal in size.  Pericardium: There is no evidence of pericardial effusion.  Mitral Valve: The mitral valve is normal in structure. No evidence of  mitral valve regurgitation. No evidence of mitral valve stenosis.  Tricuspid Valve: The tricuspid valve is normal in structure. Tricuspid  valve regurgitation is mild . No evidence of tricuspid stenosis.  Aortic Valve: The aortic valve is tricuspid. Aortic valve regurgitation is  trivial. Mild to moderate aortic valve sclerosis/calcification is present,  without any evidence of aortic stenosis.  Pulmonic Valve: The pulmonic valve was normal in structure. Pulmonic valve  regurgitation is not visualized. No evidence of pulmonic stenosis.  Aorta: The aortic root is normal in size and structure.  Venous: The inferior vena cava is normal in size with greater than 50%  respiratory variability, suggesting right atrial pressure of 3 mmHg.  IAS/Shunts: No atrial level shunt detected by color flow Doppler.   Vas US Carotid 09/02/20  Right Carotid: Velocities in the right ICA are consistent with a 1-39%  stenosis.  Left Carotid: Velocities in the left ICA are consistent with a 1-39%  stenosis.  Vertebrals: Right vertebral artery was not visualized. Lt is a blunted  signal.  Subclavians: Normal flow hemodynamics were seen in bilateral subclavian               arteries.    Recent Labs: 09/10/2021: ALT 26; BUN 18; Creatinine,  Ser 0.90; Hemoglobin 12.6; Platelets 211; Potassium 3.9; Sodium 135  Recent Lipid Panel No results found for: CHOL, TRIG, HDL, CHOLHDL, VLDL, LDLCALC, LDLDIRECT   Risk Assessment/Calculations:       Physical Exam:    VS:  BP 122/60    Pulse (!) 54    Ht 5\' 8"  (1.727 m)    Wt 186 lb 9.6 oz (84.6 kg)    SpO2 96%    BMI 28.37 kg/m     Wt Readings from Last 3 Encounters:  10/13/21 186 lb 9.6 oz (84.6 kg)  08/04/21 188 lb (85.3 kg)  09/02/20 187 lb 9.6 oz (85.1 kg)     GEN:  Well nourished, well developed in no acute distress. He is verbal but does not answer questions.  HEENT: Normal NECK: No JVD; No carotid bruits CARDIAC: RRR, no murmurs, rubs, gallops RESPIRATORY:  Clear to auscultation without rales, wheezing or rhonchi  ABDOMEN: Soft, non-tender, non-distended MUSCULOSKELETAL:  No edema; No  deformity. 2+ pedal pulses, equal bilaterally SKIN: Warm and dry NEUROLOGIC:  Alert. Unable to evaluate level of orientation as he does not answer direct questions.   EKG:  EKG is ordered today.  The ekg ordered today demonstrates sinus bradycardia @ 55 bpm, normal PR interval, nonspecific T wave abnormality, no acute change from previous  Diagnoses:    1. Bradycardia   2. Syncope, unspecified syncope type   3. Essential hypertension   4. Dementia, unspecified dementia severity, unspecified dementia type, unspecified whether behavioral, psychotic, or mood disturbance or anxiety (HCC)    Assessment and Plan:     Bradycardia: EKG today reveals sinus bradycardia at 55 bpm, no indication of heart block, QT prolongation, or changes suggestive of recent ischemia.  Heart rate consistent with findings at recent ED visits. We will place a 14-day monitor as noted below for questionable syncope and to r/o significant bradycardia.   Syncope: Has had 2 episodes of questionable syncope.  ED physician records reveal patient was once found on the floor and once slumped in a wheelchair.  There is no  witnessed syncope.  Cardiac monitor has been ordered in the past but not completed.  Spoke with patient's daughter and she is agreeable to ensure that the monitor is placed on the patient and then returned at the end of 14 days.  Explained that this will be the most significant test for further evaluation of cardiogenic syncope.   Dementia: Significant dementia makes obtaining history from patient very difficult. Will discuss further cardiac work-up with family based on monitor findings.   Essential hypertension: Blood pressure is stable today.  Paperwork from SNF also reveals well-controlled blood pressure.  Continue lisinopril.   Disposition: TBD by monitor results. Would favor PRN follow-up if results are unrevealing   Medication Adjustments/Labs and Tests Ordered: Current medicines are reviewed at length with the patient today.  Concerns regarding medicines are outlined above.  Orders Placed This Encounter  Procedures   LONG TERM MONITOR (3-14 DAYS)   EKG 12-Lead   No orders of the defined types were placed in this encounter.   Patient Instructions  Medication Instructions:  The current medical regimen is effective;  continue present plan and medications as directed. Please refer to the Current Medication list given to you today.   *If you need a refill on your cardiac medications before your next appointment, please call your pharmacy*  Lab Work:   Testing/Procedures:  NONE    NONE  Follow-Up: Your next appointment:  TBD-MONITOR  In Person with Bryan Lemma, MD   Please call our office 2 months in advance to schedule this appointment  At [facility], you and your health needs are our priority.  As part of our continuing mission to provide you with exceptional heart care, we have created designated Provider Care Teams.  These Care Teams include your primary Cardiologist (physician) and Advanced Practice Providers (APPs -  Physician Assistants and Nurse Practitioners) who  all work together to provide you with the care you need, when you need it.  We recommend signing up for the patient portal called "MyChart".  Sign up information is provided on this After Visit Summary.  MyChart is used to connect with patients for Virtual Visits (Telemedicine).  Patients are able to view lab/test results, encounter notes, upcoming appointments, etc.  Non-urgent messages can be sent to your provider as well.   To learn more about what you can do with MyChart, go to [email or web address].  Special Instructions: ZIO XT- Long Term Monitor Instructions  Your physician has requested you wear a ZIO patch monitor for 14 days.  This is a single patch monitor. [employer] supplies one patch monitor per enrollment. Additional stickers are not available. Please do not apply patch if you will be having a Nuclear Stress Test,  Echocardiogram, Cardiac CT, MRI, or Chest Xray during the period you would be wearing the  monitor. The patch cannot be worn during these tests. You cannot remove and re-apply the  ZIO XT patch monitor.  Your ZIO patch monitor will be mailed 3 day USPS to your address on file. It may take 3-5 days  to receive your monitor after you have been enrolled.  Once you have received your monitor, please review the enclosed instructions. Your monitor  has already been registered assigning a specific monitor serial # to you.  Billing and Patient Assistance Program Information  We have supplied [employer] with any of your insurance information on file for billing purposes. [employer] offers a sliding scale Patient Assistance Program for patients that do not have  insurance, or whose insurance does not completely cover the cost of the ZIO monitor.  You must apply for the Patient Assistance Program to qualify for this discounted rate.  To apply, please call [employer] at [phone], select option 4, select option 2, ask to apply for  Patient Assistance Program. Meredeth Ide will ask  your household income, and how many people  are in your household. They will quote your out-of-pocket cost based on that information.  [employer] will also be able to set up a 21-month, interest-free payment plan if needed.  Applying the monitor   Shave hair from upper left chest.  Hold abrader disc by orange tab. Rub abrader in 40 strokes over the upper left chest as  indicated in your monitor instructions.  Clean area with 4 enclosed alcohol pads. Let dry.  Apply patch as indicated in monitor instructions. Patch will be placed under collarbone on left  side of chest with arrow pointing upward.  Rub patch adhesive wings for 2 minutes. Remove white label marked "1". Remove the white  label marked "2". Rub patch adhesive wings for 2 additional minutes.  While looking in a mirror, press and release button in center of patch. A small green light will  flash 3-4 times. This will be your only indicator that the monitor has been turned on.  Do not shower for the first 24 hours. You may shower after the first 24 hours.  Press the button if you feel a symptom. You will hear a small click. Record Date, Time and  Symptom in the Patient Logbook.  When you are ready to remove the patch, follow instructions on the last 2 pages of Patient  Logbook. Stick patch monitor onto the last page of Patient Logbook.  Place Patient Logbook in the blue and white box. Use locking tab on box and tape box closed  securely. The blue and white box has prepaid postage on it. Please place it in the mailbox as  soon as possible. Your physician should have your test results approximately 7 days after the  monitor has been mailed back to [facility].  Call [facility] Customer Care at [phone] if you have questions regarding  your ZIO XT patch monitor. Call them immediately if you see an orange light blinking on your  monitor.  If your monitor falls off in less than 4 days, contact our Monitor department at  [phone].  If your monitor becomes loose or falls off after 4 days call [employer] at [phone] for  suggestions on securing your monitor         Signed, Levi AlandSwinyer, Chera Slivka M, NP  10/13/2021 9:54 AM    [facility]

## 2021-10-13 ENCOUNTER — Ambulatory Visit (INDEPENDENT_AMBULATORY_CARE_PROVIDER_SITE_OTHER): Payer: Medicare Other | Admitting: Nurse Practitioner

## 2021-10-13 ENCOUNTER — Other Ambulatory Visit: Payer: Self-pay

## 2021-10-13 ENCOUNTER — Ambulatory Visit (INDEPENDENT_AMBULATORY_CARE_PROVIDER_SITE_OTHER): Payer: Medicare Other

## 2021-10-13 ENCOUNTER — Encounter: Payer: Self-pay | Admitting: General Practice

## 2021-10-13 ENCOUNTER — Telehealth: Payer: Self-pay | Admitting: Cardiology

## 2021-10-13 VITALS — BP 122/60 | HR 54 | Ht 68.0 in | Wt 186.6 lb

## 2021-10-13 DIAGNOSIS — I10 Essential (primary) hypertension: Secondary | ICD-10-CM

## 2021-10-13 DIAGNOSIS — R55 Syncope and collapse: Secondary | ICD-10-CM | POA: Diagnosis not present

## 2021-10-13 DIAGNOSIS — F0390 Unspecified dementia without behavioral disturbance: Secondary | ICD-10-CM

## 2021-10-13 DIAGNOSIS — R001 Bradycardia, unspecified: Secondary | ICD-10-CM

## 2021-10-13 NOTE — Telephone Encounter (Signed)
They spoke with Eligha Bridegroom, no DNR left here

## 2021-10-13 NOTE — Patient Instructions (Signed)
Medication Instructions:  The current medical regimen is effective;  continue present plan and medications as directed. Please refer to the Current Medication list given to you today.   *If you need a refill on your cardiac medications before your next appointment, please call your pharmacy*  Lab Work:   Testing/Procedures:  NONE    NONE  Follow-Up: Your next appointment:  TBD-MONITOR  In Person with Wyatt Lemma, MD   Please call our office 2 months in advance to schedule this appointment  At [facility], you and your health needs are our priority.  As part of our continuing mission to provide you with exceptional heart care, we have created designated Provider Care Teams.  These Care Teams include your primary Cardiologist (physician) and Advanced Practice Providers (APPs -  Physician Assistants and Nurse Practitioners) who all work together to provide you with the care you need, when you need it.  We recommend signing up for the patient portal called "MyChart".  Sign up information is provided on this After Visit Summary.  MyChart is used to connect with patients for Virtual Visits (Telemedicine).  Patients are able to view lab/test results, encounter notes, upcoming appointments, etc.  Non-urgent messages can be sent to your provider as well.   To learn more about what you can do with MyChart, go to [email or web address].      Special Instructions: ZIO XT- Long Term Monitor Instructions  Your physician has requested you wear a ZIO patch monitor for 14 days.  This is a single patch monitor. [employer] supplies one patch monitor per enrollment. Additional stickers are not available. Please do not apply patch if you will be having a Nuclear Stress Test,  Echocardiogram, Cardiac CT, MRI, or Chest Xray during the period you would be wearing the  monitor. The patch cannot be worn during these tests. You cannot remove and re-apply the  ZIO XT patch monitor.  Your ZIO patch monitor will  be mailed 3 day USPS to your address on file. It may take 3-5 days  to receive your monitor after you have been enrolled.  Once you have received your monitor, please review the enclosed instructions. Your monitor  has already been registered assigning a specific monitor serial # to you.  Billing and Patient Assistance Program Information  We have supplied [employer] with any of your insurance information on file for billing purposes. [employer] offers a sliding scale Patient Assistance Program for patients that do not have  insurance, or whose insurance does not completely cover the cost of the ZIO monitor.  You must apply for the Patient Assistance Program to qualify for this discounted rate.  To apply, please call [employer] at [phone], select option 4, select option 2, ask to apply for  Patient Assistance Program. Meredeth Ide will ask your household income, and how many people  are in your household. They will quote your out-of-pocket cost based on that information.  [employer] will also be able to set up a 72-month, interest-free payment plan if needed.  Applying the monitor   Shave hair from upper left chest.  Hold abrader disc by orange tab. Rub abrader in 40 strokes over the upper left chest as  indicated in your monitor instructions.  Clean area with 4 enclosed alcohol pads. Let dry.  Apply patch as indicated in monitor instructions. Patch will be placed under collarbone on left  side of chest with arrow pointing upward.  Rub patch adhesive wings for 2 minutes. Remove white label marked "1". Remove  the white  label marked "2". Rub patch adhesive wings for 2 additional minutes.  While looking in a mirror, press and release button in center of patch. A small green light will  flash 3-4 times. This will be your only indicator that the monitor has been turned on.  Do not shower for the first 24 hours. You may shower after the first 24 hours.  Press the button if you feel a symptom. You will  hear a small click. Record Date, Time and  Symptom in the Patient Logbook.  When you are ready to remove the patch, follow instructions on the last 2 pages of Patient  Logbook. Stick patch monitor onto the last page of Patient Logbook.  Place Patient Logbook in the blue and white box. Use locking tab on box and tape box closed  securely. The blue and white box has prepaid postage on it. Please place it in the mailbox as  soon as possible. Your physician should have your test results approximately 7 days after the  monitor has been mailed back to [facility].  Call [facility] Customer Care at [phone] if you have questions regarding  your ZIO XT patch monitor. Call them immediately if you see an orange light blinking on your  monitor.  If your monitor falls off in less than 4 days, contact our Monitor department at [phone].  If your monitor becomes loose or falls off after 4 days call [employer] at [phone] for  suggestions on securing your monitor

## 2021-10-13 NOTE — Telephone Encounter (Signed)
See message below.  Will route to NP who seen patient today.   Thanks!

## 2021-10-13 NOTE — Progress Notes (Unsigned)
Enrolled for [employer] to mail a ZIO XT long term holter monitor daughters home [place], [address] [place], [place], [place]  [address].

## 2021-10-13 NOTE — Telephone Encounter (Signed)
Patient DNR was life at our office one of staff is going to come back to our office to pick that up. Please advise

## 2021-10-17 DIAGNOSIS — R55 Syncope and collapse: Secondary | ICD-10-CM | POA: Diagnosis not present

## 2021-10-17 DIAGNOSIS — R001 Bradycardia, unspecified: Secondary | ICD-10-CM

## 2022-02-04 IMAGING — DX DG CHEST 1V PORT
1 series · 1 of 1 positions shown · non-contrast
Comparison: None.

CLINICAL DATA: Syncope.

EXAM:
PORTABLE CHEST 1 VIEW

[chest]
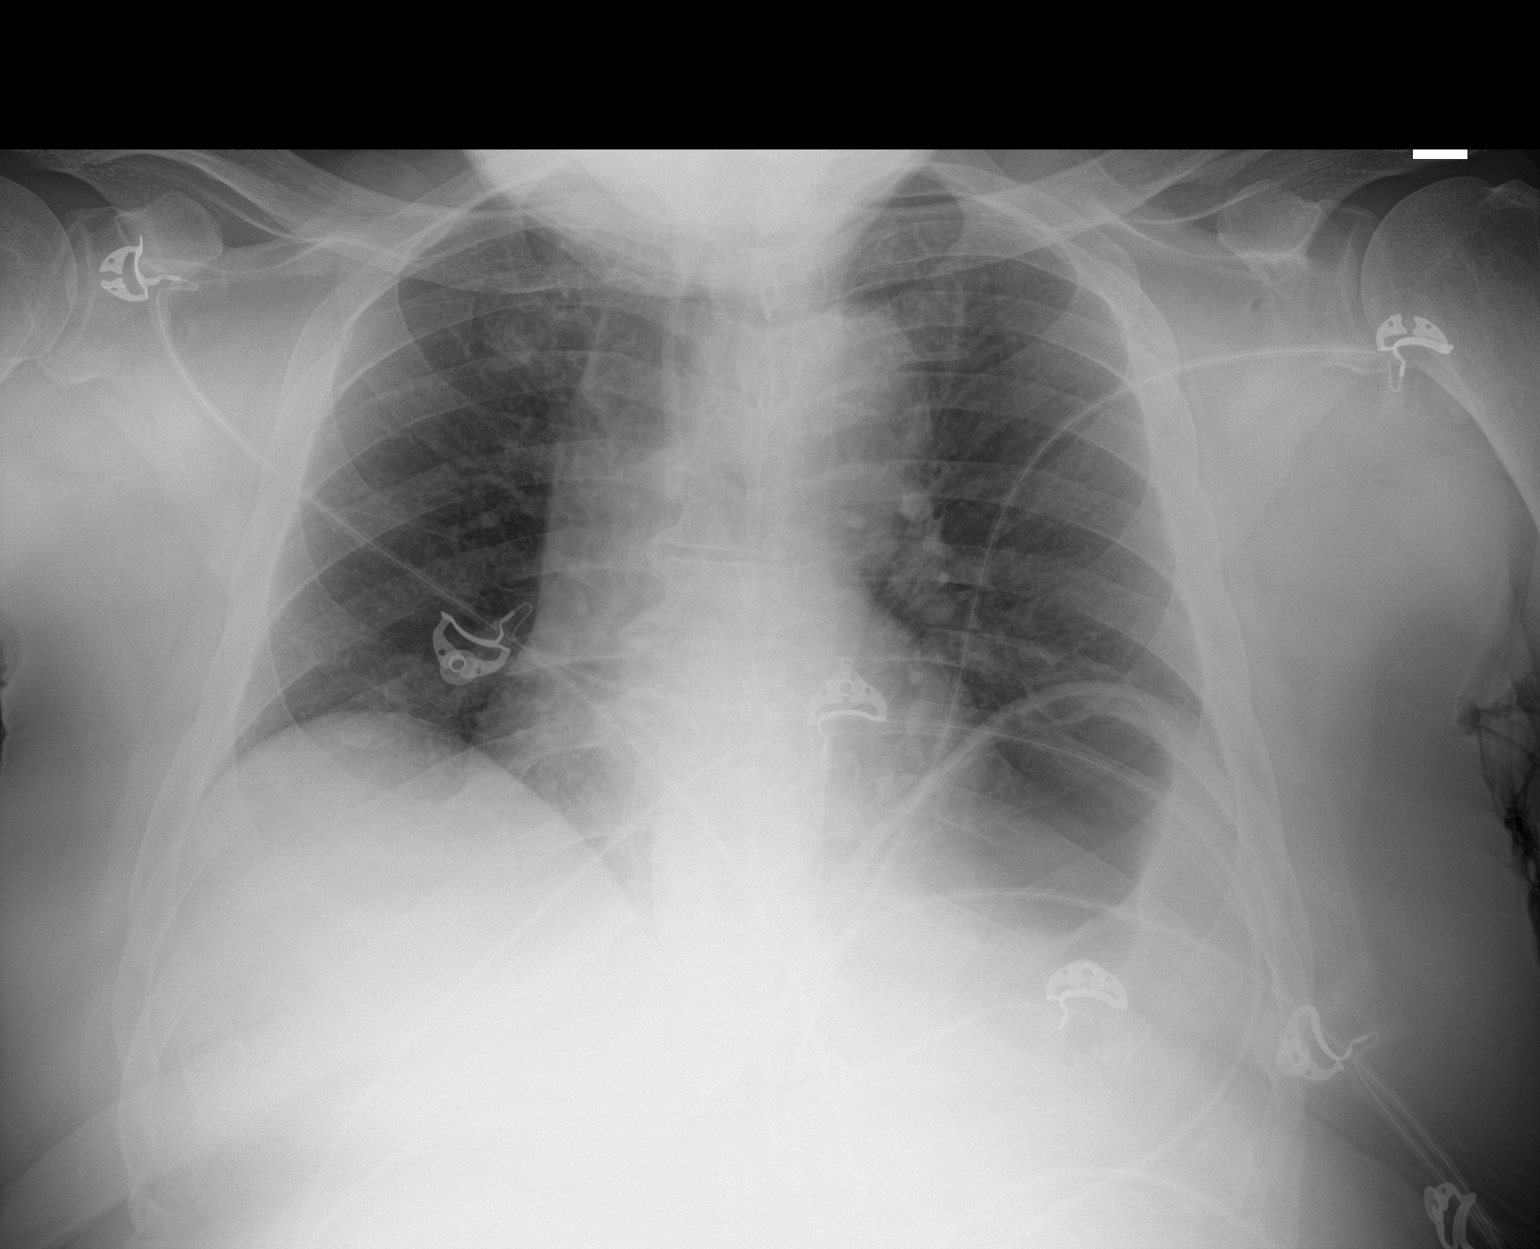

[1 of 1 positions shown; findings below may reference images not displayed]

FINDINGS: The heart size and mediastinal contours are within normal limits.
Both lungs are clear. No pneumothorax or pleural effusion is noted.
The visualized skeletal structures are unremarkable.
IMPRESSION: No active disease.
